# Patient Record
Sex: Male | Born: 1980 | Race: Black or African American | Hispanic: No | Marital: Single | Smoking: Current every day smoker
Health system: Southern US, Community
[De-identification: ages and names within clinical notes are randomized; demographics above are authoritative.]

## PROBLEM LIST (undated history)

## (undated) DIAGNOSIS — J45909 Unspecified asthma, uncomplicated: Secondary | ICD-10-CM

---
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
## 2003-11-07 ENCOUNTER — Emergency Department (HOSPITAL_COMMUNITY): Admission: EM | Admit: 2003-11-07 | Discharge: 2003-11-07 | Payer: Self-pay | Admitting: Emergency Medicine

## 2008-01-06 ENCOUNTER — Emergency Department (HOSPITAL_COMMUNITY): Admission: EM | Admit: 2008-01-06 | Discharge: 2008-01-07 | Payer: Self-pay | Admitting: Emergency Medicine

## 2008-06-13 ENCOUNTER — Emergency Department (HOSPITAL_COMMUNITY): Admission: EM | Admit: 2008-06-13 | Discharge: 2008-06-13 | Payer: Self-pay | Admitting: *Deleted

## 2008-06-13 ENCOUNTER — Emergency Department (HOSPITAL_COMMUNITY): Admission: EM | Admit: 2008-06-13 | Discharge: 2008-06-14 | Payer: Self-pay | Admitting: Emergency Medicine

## 2008-06-14 ENCOUNTER — Emergency Department (HOSPITAL_COMMUNITY): Admission: EM | Admit: 2008-06-14 | Discharge: 2008-06-15 | Payer: Self-pay | Admitting: Emergency Medicine

## 2008-06-16 ENCOUNTER — Emergency Department (HOSPITAL_COMMUNITY): Admission: EM | Admit: 2008-06-16 | Discharge: 2008-06-16 | Payer: Self-pay | Admitting: Emergency Medicine

## 2010-10-11 ENCOUNTER — Emergency Department (HOSPITAL_COMMUNITY): Admission: EM | Admit: 2010-10-11 | Discharge: 2010-10-11 | Payer: Self-pay | Admitting: Emergency Medicine

## 2011-02-01 LAB — COMPREHENSIVE METABOLIC PANEL
BUN: 11 mg/dL (ref 6–23)
CO2: 25 mEq/L (ref 19–32)
Calcium: 9.8 mg/dL (ref 8.4–10.5)
Chloride: 101 mEq/L (ref 96–112)
Creatinine, Ser: 1.27 mg/dL (ref 0.4–1.5)
GFR calc Af Amer: >60 mL/min (ref >60)
GFR calc non Af Amer: >60 mL/min (ref >60)
Glucose, Bld: 107 mg/dL — ABNORMAL HIGH (ref 70–99)
Total Bilirubin: 0.9 mg/dL (ref 0.3–1.2)

## 2011-02-01 LAB — CBC
Hemoglobin: 15.2 g/dL (ref 13.0–17.0)
MCH: 24 pg — ABNORMAL LOW (ref 26.0–34.0)
MCHC: 34 g/dL (ref 30.0–36.0)
MCV: 70.6 fL — ABNORMAL LOW (ref 78.0–100.0)
RBC: 6.33 MIL/uL — ABNORMAL HIGH (ref 4.22–5.81)

## 2011-02-01 LAB — DIFFERENTIAL
Basophils Absolute: 0 10*3/uL (ref 0.0–0.1)
Eosinophils Relative: 0 % (ref 0–5)
Lymphocytes Relative: 43 % (ref 12–46)
Lymphs Abs: 4.9 10*3/uL — ABNORMAL HIGH (ref 0.7–4.0)
Monocytes Relative: 9 % (ref 3–12)
Neutrophils Relative %: 48 % (ref 43–77)

## 2011-02-01 LAB — LIPASE, BLOOD: Lipase: 47 U/L (ref 11–59)

## 2011-02-11 ENCOUNTER — Emergency Department (HOSPITAL_COMMUNITY)
Admission: EM | Admit: 2011-02-11 | Discharge: 2011-02-11 | Disposition: A | Discharge status: Home / Self Care | Payer: Self-pay | Attending: Cardiovascular Disease | Admitting: Cardiovascular Disease

## 2011-02-11 DIAGNOSIS — R197 Diarrhea, unspecified: Secondary | ICD-10-CM | POA: Insufficient documentation

## 2011-02-11 DIAGNOSIS — R112 Nausea with vomiting, unspecified: Secondary | ICD-10-CM | POA: Insufficient documentation

## 2011-02-11 LAB — POCT I-STAT, CHEM 8
Creatinine, Ser: 1.3 mg/dL (ref 0.4–1.5)
HCT: 48 % (ref 39.0–52.0)
Hemoglobin: 16.3 g/dL (ref 13.0–17.0)
Potassium: 3.8 mEq/L (ref 3.5–5.1)
Sodium: 142 mEq/L (ref 135–145)
TCO2: 25 mmol/L (ref 0–100)

## 2011-02-13 ENCOUNTER — Emergency Department (HOSPITAL_COMMUNITY)
Admission: EM | Admit: 2011-02-13 | Discharge: 2011-02-13 | Disposition: A | Discharge status: Home / Self Care | Payer: Self-pay | Attending: Emergency Medicine | Admitting: Emergency Medicine

## 2011-02-13 DIAGNOSIS — R112 Nausea with vomiting, unspecified: Secondary | ICD-10-CM | POA: Insufficient documentation

## 2011-02-13 DIAGNOSIS — B9789 Other viral agents as the cause of diseases classified elsewhere: Secondary | ICD-10-CM | POA: Insufficient documentation

## 2011-02-13 LAB — POCT I-STAT, CHEM 8
Glucose, Bld: 108 mg/dL — ABNORMAL HIGH (ref 70–99)
HCT: 47 % (ref 39.0–52.0)
Hemoglobin: 16 g/dL (ref 13.0–17.0)
Potassium: 3.5 mEq/L (ref 3.5–5.1)

## 2011-02-14 ENCOUNTER — Observation Stay (HOSPITAL_COMMUNITY)
Admission: EM | Admit: 2011-02-14 | Discharge: 2011-02-14 | Disposition: A | Discharge status: Home / Self Care | Payer: Self-pay | Attending: Emergency Medicine | Admitting: Emergency Medicine

## 2011-02-14 ENCOUNTER — Emergency Department (HOSPITAL_COMMUNITY): Payer: Self-pay

## 2011-02-14 DIAGNOSIS — R197 Diarrhea, unspecified: Secondary | ICD-10-CM | POA: Insufficient documentation

## 2011-02-14 DIAGNOSIS — R109 Unspecified abdominal pain: Secondary | ICD-10-CM | POA: Insufficient documentation

## 2011-02-14 DIAGNOSIS — R112 Nausea with vomiting, unspecified: Principal | ICD-10-CM | POA: Insufficient documentation

## 2011-02-14 LAB — DIFFERENTIAL
Basophils Absolute: 0 10*3/uL (ref 0.0–0.1)
Lymphs Abs: 2.8 10*3/uL (ref 0.7–4.0)
Monocytes Absolute: 0.8 10*3/uL (ref 0.1–1.0)
Monocytes Relative: 7 % (ref 3–12)
Neutrophils Relative %: 69 % (ref 43–77)

## 2011-02-14 LAB — CBC
Hemoglobin: 14.5 g/dL (ref 13.0–17.0)
MCHC: 34.3 g/dL (ref 30.0–36.0)
RDW: 15.8 % — ABNORMAL HIGH (ref 11.5–15.5)
WBC: 11.5 10*3/uL — ABNORMAL HIGH (ref 4.0–10.5)

## 2011-02-14 LAB — COMPREHENSIVE METABOLIC PANEL
ALT: 16 U/L (ref 0–53)
AST: 18 U/L (ref 0–37)
Albumin: 4.2 g/dL (ref 3.5–5.2)
Calcium: 9 mg/dL (ref 8.4–10.5)
GFR calc Af Amer: >60 mL/min (ref >60)
Sodium: 137 mEq/L (ref 135–145)
Total Protein: 7.2 g/dL (ref 6.0–8.3)

## 2011-02-14 MED ORDER — IOHEXOL 300 MG/ML  SOLN
100.0000 mL | Freq: Once | INTRAMUSCULAR | Status: AC | PRN
  Administered 2011-02-14: 100 mL via INTRAVENOUS
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
## 2011-04-08 NOTE — Op Note (Signed)
[facility]  Patient:    Edward Barrera, Edward Barrera                     MRN: [id number] Proc. Date: 02/01/01 Adm. Date:  [id number] Disc. Date: [id number] Attending:  Carren Rang K                           Operative Report  PREOPERATIVE DIAGNOSIS:  Displaced fourth and fifth proximal phalanx fractures left hand, status post initial reduction with progressive angulation.  POSTOPERATIVE DIAGNOSIS:  Displaced fourth and fifth proximal phalanx fractures left hand, status post initial reduction with progressive angulation.  PROCEDURE:  Attempted closed reduction and pinning, followed by formal open reduction/internal fixation left fourth and fifth proximal phalanx fractures.  SURGEON:  Elisha Ponder, M.D.  ASSISTANT:  Dorie Rank, P.A.  ANESTHESIA:  LMA general anesthetic.  TOURNIQUET TIME:  Less than 60 minutes.  DRAINS:  None.  COMPLICATIONS:  None.  INDICATIONS FOR THE PROCEDURE:  This patient is a very pleasant 30 year old black male, who presents with the above-mentioned diagnosis.  I have counselled he and his mother in regards to risk and benefits of surgery, including risk of infection, bleeding, anesthesia, damage to normal structures and failure of surgery to accomplish its intended goals of relieving symptoms and restoring function.  With this in mind, he decides to proceed.  All questions have been encouraged and answered preoperatively.  OPERATIVE FINDINGS:  Patient had highly unstable displaced transverse proximal phalanx fractures.  Attempted closed reduction was performed, followed by a formal open reduction/internal fixation.  K-wires 0.045 were used for fixation.  Fixation was firm and stable.  DESCRIPTION OF PROCEDURE:  The patient was taken to the operative suite after counseling him by myself and anesthesia.  Once in the operative suite, he underwent prophylactic antibiotics in the form of Ancef.  He was laid  supine, appropriate padded.  LMA general anesthetic was introduced smoothly. Following this, the left upper extremity was prepped and draped in the usual sterile fashion.  This was supervised by myself.  Once this was done, the sterile field was secured, the patient had attempted closed reduction of the fourth and fifth proximal phalanx fractures.  Closed reduction was unsuccessful.  The patient had highly unstable fractures and difficulty was noted in trying to reduce these.  With this noted, the patient then had formal open reduction accomplished.  This was done by elevating the upper extremity (left upper extremity) and following this, tourniquet was inflated to 250 mmHg.  Two longitudinal incisions were made dorsally in the midline over the fourth and fifth proximal phalanx.  This was in the proximal aspect. Dissection was carried down in both areas through the subcu, crossing veins were coagulated with bipolar electrocautery and the patient then had the extensor tendon split and carefully retracted at the fracture site.  The periosteum was opened in a L-shaped fashion for later repair and the fracture site was exposed, curetted and then irrigated.  Once this was done, two 0.045 K-wires were placed from a proximal to distal direction, exited about the mid lateral line of the fingers and were withdrawn to the fracture site of the distal fragment.  Following this, reduction was accomplished visually under 4.0 loupe magnification.  This was held and the wires were then advanced proximally until they engaged the cortex nicely.  This provided firm fixation. This was done for both the fourth and fifth proximal  phalanx fractures.  Both these fractures were highly unstable.  The fifth fracture site was button holed volarly.   This may portend to increase stiffness in the flexor tendon region.  I have discussed these issues with the patients mother postoperatively.  Once the fractures were  pinned successfully, the patient underwent permanent radiographs for documentation, followed by tourniquet deflation, closure of the wounds about the fourth and fifth regions was accomplished with chromic in the periosteal tissue, followed by Mersilene of the 4-0 variety in the extensor tendon followed by Prolene suture of the 4-0 variety in the skin edge.  The patient tolerated this well without difficulty. Pins were left outside the skin, bent 45 degrees, caps were placed, Xeroform was placed over the wounds, excellent refill was noted, Marcaine 0.25% without epinephrine was placed for postoperative analgesia and the patient tolerated the procedure well.  He was extubated in the operative suite and transferred to recovery room in stable condition.  All sponge, needle and instruments counts reported as correct.  There were no complications.  He will be discharged home on Keflex x 4 days, Vicodin and Robaxin.  He will return to our office in approximately seven days.  All questions have been encouraged and answered. DD:  02/01/01 TD:  02/01/01 Job: [id number]

## 2011-08-19 LAB — CBC
HCT: 49.3
HCT: 53 — ABNORMAL HIGH
Hemoglobin: 16.8
MCHC: 31.8
MCHC: 32.2
MCV: 76.3 — ABNORMAL LOW
MCV: 76.3 — ABNORMAL LOW
Platelets: 181
RBC: 6.61 — ABNORMAL HIGH
RDW: 16 — ABNORMAL HIGH
RDW: 16.8 — ABNORMAL HIGH
WBC: 10.3

## 2011-08-19 LAB — COMPREHENSIVE METABOLIC PANEL
Albumin: 4.6
Alkaline Phosphatase: 39
Alkaline Phosphatase: 44
BUN: 10
BUN: 18
Calcium: 10.3
Calcium: 9.5
Glucose, Bld: 95
Potassium: 3.4 — ABNORMAL LOW
Potassium: 3.8
Sodium: 132 — ABNORMAL LOW
Total Protein: 7.5
Total Protein: 8.3

## 2011-08-19 LAB — DIFFERENTIAL
Basophils Relative: 0
Basophils Relative: 0
Eosinophils Absolute: 0
Lymphocytes Relative: 27
Lymphs Abs: 2.6
Lymphs Abs: 2.8
Monocytes Absolute: 1.3 — ABNORMAL HIGH
Monocytes Relative: 12
Monocytes Relative: 13 — ABNORMAL HIGH
Monocytes Relative: 9
Neutro Abs: 5.9
Neutro Abs: 6.2
Neutro Abs: 8.2 — ABNORMAL HIGH
Neutrophils Relative %: 60
Neutrophils Relative %: 68

## 2011-08-19 LAB — URINALYSIS, ROUTINE W REFLEX MICROSCOPIC
Glucose, UA: NEGATIVE
Hgb urine dipstick: NEGATIVE
Specific Gravity, Urine: 1.027

## 2011-08-19 LAB — POCT I-STAT, CHEM 8
BUN: 17
Chloride: 101
Creatinine, Ser: 1.2
Glucose, Bld: 92
Potassium: 3.6

## 2011-08-19 LAB — URINE MICROSCOPIC-ADD ON

## 2012-04-29 ENCOUNTER — Encounter (HOSPITAL_COMMUNITY)
Admission: EM | Disposition: A | Discharge status: Home / Self Care | Payer: Self-pay | Source: Home / Self Care | Attending: Orthopedic Surgery

## 2012-04-29 ENCOUNTER — Encounter (HOSPITAL_COMMUNITY): Payer: Self-pay | Admitting: Anesthesiology

## 2012-04-29 ENCOUNTER — Encounter (HOSPITAL_COMMUNITY): Payer: Self-pay | Admitting: Emergency Medicine

## 2012-04-29 ENCOUNTER — Emergency Department (HOSPITAL_COMMUNITY): Payer: Self-pay | Admitting: Anesthesiology

## 2012-04-29 ENCOUNTER — Encounter (HOSPITAL_COMMUNITY): Payer: Self-pay | Admitting: Orthopedic Surgery

## 2012-04-29 ENCOUNTER — Inpatient Hospital Stay (HOSPITAL_COMMUNITY)
Admission: EM | Admit: 2012-04-29 | Discharge: 2012-05-01 | DRG: 906 | Disposition: A | Discharge status: Home / Self Care | Payer: Self-pay | Attending: Orthopedic Surgery | Admitting: Orthopedic Surgery

## 2012-04-29 ENCOUNTER — Emergency Department (HOSPITAL_COMMUNITY): Payer: Self-pay

## 2012-04-29 DIAGNOSIS — F17200 Nicotine dependence, unspecified, uncomplicated: Secondary | ICD-10-CM | POA: Diagnosis present

## 2012-04-29 DIAGNOSIS — S61209A Unspecified open wound of unspecified finger without damage to nail, initial encounter: Principal | ICD-10-CM | POA: Diagnosis present

## 2012-04-29 DIAGNOSIS — M8448XA Pathological fracture, other site, initial encounter for fracture: Secondary | ICD-10-CM | POA: Diagnosis present

## 2012-04-29 DIAGNOSIS — Z1832 Retained tooth: Secondary | ICD-10-CM

## 2012-04-29 HISTORY — PX: I&D EXTREMITY: SHX5045

## 2012-04-29 LAB — BASIC METABOLIC PANEL
Calcium: 9.5 mg/dL (ref 8.4–10.5)
GFR calc Af Amer: >90 mL/min (ref >90)
GFR calc non Af Amer: 81 mL/min — ABNORMAL LOW (ref >90)
Glucose, Bld: 96 mg/dL (ref 70–99)
Potassium: 4.1 mEq/L (ref 3.5–5.1)
Sodium: 142 mEq/L (ref 135–145)

## 2012-04-29 LAB — CBC
MCH: 23.6 pg — ABNORMAL LOW (ref 26.0–34.0)
MCHC: 32.7 g/dL (ref 30.0–36.0)
Platelets: 209 10*3/uL (ref 150–400)
RDW: 16.8 % — ABNORMAL HIGH (ref 11.5–15.5)

## 2012-04-29 SURGERY — IRRIGATION AND DEBRIDEMENT EXTREMITY
Anesthesia: General | Site: Hand | Laterality: Right | Wound class: Contaminated

## 2012-04-29 MED ORDER — OXYCODONE HCL 5 MG PO TABS
5.0000 mg | ORAL_TABLET | ORAL | Status: DC | PRN
  Administered 2012-04-29: 10 mg via ORAL
  Administered 2012-04-29 (×2): 5 mg via ORAL
  Administered 2012-04-30 – 2012-05-01 (×10): 10 mg via ORAL
  Filled 2012-04-29 (×9): qty 2
  Filled 2012-04-29: qty 1
  Filled 2012-04-29: qty 2
  Filled 2012-04-29: qty 1
  Filled 2012-04-29: qty 2

## 2012-04-29 MED ORDER — LACTATED RINGERS IV SOLN
INTRAVENOUS | Status: DC
  Administered 2012-04-29: 20:00:00 via INTRAVENOUS

## 2012-04-29 MED ORDER — FENTANYL CITRATE 0.05 MG/ML IJ SOLN
INTRAMUSCULAR | Status: DC | PRN
  Administered 2012-04-29 (×3): 50 ug via INTRAVENOUS
  Administered 2012-04-29: 100 ug via INTRAVENOUS

## 2012-04-29 MED ORDER — DOCUSATE SODIUM 100 MG PO CAPS
100.0000 mg | ORAL_CAPSULE | Freq: Two times a day (BID) | ORAL | Status: DC
  Administered 2012-04-29 – 2012-05-01 (×5): 100 mg via ORAL

## 2012-04-29 MED ORDER — DEXAMETHASONE SODIUM PHOSPHATE 10 MG/ML IJ SOLN
INTRAMUSCULAR | Status: DC | PRN
  Administered 2012-04-29: 10 mg via INTRAVENOUS

## 2012-04-29 MED ORDER — PROMETHAZINE HCL 25 MG RE SUPP: 12.5000 mg | Freq: Four times a day (QID) | RECTAL | Status: DC | PRN

## 2012-04-29 MED ORDER — ONDANSETRON HCL 4 MG PO TABS: 4.0000 mg | ORAL_TABLET | Freq: Four times a day (QID) | ORAL | Status: DC | PRN

## 2012-04-29 MED ORDER — ACETAMINOPHEN 10 MG/ML IV SOLN
INTRAVENOUS | Status: DC | PRN
  Administered 2012-04-29: 1000 mg via INTRAVENOUS

## 2012-04-29 MED ORDER — PROPOFOL 10 MG/ML IV EMUL
INTRAVENOUS | Status: DC | PRN
  Administered 2012-04-29: 200 mg via INTRAVENOUS

## 2012-04-29 MED ORDER — MORPHINE SULFATE 2 MG/ML IJ SOLN
1.0000 mg | INTRAMUSCULAR | Status: DC | PRN
  Administered 2012-04-29: 1 mg via INTRAVENOUS
  Filled 2012-04-29: qty 1

## 2012-04-29 MED ORDER — ONDANSETRON HCL 4 MG/2ML IJ SOLN
INTRAMUSCULAR | Status: DC | PRN
  Administered 2012-04-29: 4 mg via INTRAVENOUS

## 2012-04-29 MED ORDER — HYDROMORPHONE HCL PF 1 MG/ML IJ SOLN
1.0000 mg | Freq: Once | INTRAMUSCULAR | Status: AC
  Administered 2012-04-29: 1 mg via INTRAVENOUS
  Filled 2012-04-29: qty 1

## 2012-04-29 MED ORDER — ONDANSETRON HCL 4 MG/2ML IJ SOLN: 4.0000 mg | Freq: Four times a day (QID) | INTRAMUSCULAR | Status: DC | PRN

## 2012-04-29 MED ORDER — LACTATED RINGERS IV SOLN
INTRAVENOUS | Status: DC | PRN
  Administered 2012-04-29: 10:00:00 via INTRAVENOUS

## 2012-04-29 MED ORDER — ACETAMINOPHEN 10 MG/ML IV SOLN
INTRAVENOUS | Status: AC
  Filled 2012-04-29: qty 100

## 2012-04-29 MED ORDER — SODIUM CHLORIDE 0.9 % IV SOLN
3.0000 g | Freq: Four times a day (QID) | INTRAVENOUS | Status: DC
  Administered 2012-04-29 – 2012-05-01 (×8): 3 g via INTRAVENOUS
  Filled 2012-04-29 (×11): qty 3

## 2012-04-29 MED ORDER — SUCCINYLCHOLINE CHLORIDE 20 MG/ML IJ SOLN
INTRAMUSCULAR | Status: DC | PRN
Start: 2012-04-29 — End: 2012-04-29
  Administered 2012-04-29: 100 mg via INTRAVENOUS

## 2012-04-29 MED ORDER — SODIUM CHLORIDE 0.9 % IV SOLN
3.0000 g | Freq: Once | INTRAVENOUS | Status: AC
  Administered 2012-04-29: 3 g via INTRAVENOUS
  Filled 2012-04-29: qty 3

## 2012-04-29 MED ORDER — LIDOCAINE HCL (CARDIAC) 20 MG/ML IV SOLN
INTRAVENOUS | Status: DC | PRN
  Administered 2012-04-29: 100 mg via INTRAVENOUS

## 2012-04-29 MED ORDER — TETANUS-DIPHTH-ACELL PERTUSSIS 5-2.5-18.5 LF-MCG/0.5 IM SUSP
0.5000 mL | Freq: Once | INTRAMUSCULAR | Status: AC
  Administered 2012-04-29: 0.5 mL via INTRAMUSCULAR
  Filled 2012-04-29: qty 0.5

## 2012-04-29 MED ORDER — FENTANYL CITRATE 0.05 MG/ML IJ SOLN: 25.0000 ug | INTRAMUSCULAR | Status: DC | PRN

## 2012-04-29 MED ORDER — VITAMIN C 500 MG PO TABS
1000.0000 mg | ORAL_TABLET | Freq: Every day | ORAL | Status: DC
  Administered 2012-04-29 – 2012-05-01 (×3): 1000 mg via ORAL
  Filled 2012-04-29 (×3): qty 2

## 2012-04-29 MED ORDER — LACTATED RINGERS IV SOLN: INTRAVENOUS | Status: DC

## 2012-04-29 SURGICAL SUPPLY — 40 items
BAG ZIPLOCK 12X15 (MISCELLANEOUS) ×2 IMPLANT
BANDAGE ELASTIC 3 VELCRO ST LF (GAUZE/BANDAGES/DRESSINGS) ×2 IMPLANT
BANDAGE ELASTIC 4 VELCRO ST LF (GAUZE/BANDAGES/DRESSINGS) ×2 IMPLANT
BANDAGE GAUZE ELAST BULKY 4 IN (GAUZE/BANDAGES/DRESSINGS) ×2 IMPLANT
BLADE SURG SZ10 CARB STEEL (BLADE) ×2 IMPLANT
CLOTH BEACON ORANGE TIMEOUT ST (SAFETY) ×2 IMPLANT
CUFF TOURN SGL QUICK 18 (TOURNIQUET CUFF) ×2 IMPLANT
DRAIN PENROSE 18X1/2 LTX STRL (DRAIN) IMPLANT
DRAPE SURG 17X11 SM STRL (DRAPES) ×2 IMPLANT
DRSG EMULSION OIL 3X3 NADH (GAUZE/BANDAGES/DRESSINGS) ×2 IMPLANT
DRSG PAD ABDOMINAL 8X10 ST (GAUZE/BANDAGES/DRESSINGS) IMPLANT
ELECT REM PT RETURN 9FT ADLT (ELECTROSURGICAL) ×2
ELECTRODE REM PT RTRN 9FT ADLT (ELECTROSURGICAL) ×1 IMPLANT
GAUZE XEROFORM 1X8 LF (GAUZE/BANDAGES/DRESSINGS) ×2 IMPLANT
GLOVE BIO SURGEON STRL SZ8 (GLOVE) ×2 IMPLANT
GOWN STRL REIN XL XLG (GOWN DISPOSABLE) ×2 IMPLANT
KIT BASIN OR (CUSTOM PROCEDURE TRAY) ×2 IMPLANT
LOOP VESSEL MAXI BLUE (MISCELLANEOUS) ×2 IMPLANT
MANIFOLD NEPTUNE II (INSTRUMENTS) ×2 IMPLANT
PACK LOWER EXTREMITY WL (CUSTOM PROCEDURE TRAY) ×2 IMPLANT
PAD CAST 3X4 CTTN HI CHSV (CAST SUPPLIES) ×1 IMPLANT
PAD CAST 4YDX4 CTTN HI CHSV (CAST SUPPLIES) ×1 IMPLANT
PADDING CAST ABS 3INX4YD NS (CAST SUPPLIES) ×1
PADDING CAST ABS COTTON 3X4 (CAST SUPPLIES) ×1 IMPLANT
PADDING CAST COTTON 3X4 STRL (CAST SUPPLIES) ×1
PADDING CAST COTTON 4X4 STRL (CAST SUPPLIES) ×1
POSITIONER SURGICAL ARM (MISCELLANEOUS) ×2 IMPLANT
SOL PREP POV-IOD 16OZ 10% (MISCELLANEOUS) ×2 IMPLANT
SOL PREP PROV IODINE SCRUB 4OZ (MISCELLANEOUS) ×2 IMPLANT
SPLINT PLASTER CAST XFAST 4X15 (CAST SUPPLIES) ×1 IMPLANT
SPLINT PLASTER XTRA FAST SET 4 (CAST SUPPLIES) ×1
SPONGE GAUZE 4X4 12PLY (GAUZE/BANDAGES/DRESSINGS) ×2 IMPLANT
SUT PROLENE 3 0 PS 2 (SUTURE) IMPLANT
SUT VIC AB 1 CT1 27 (SUTURE) ×1
SUT VIC AB 1 CT1 27XBRD ANTBC (SUTURE) ×1 IMPLANT
SUT VIC AB 2-0 CT1 27 (SUTURE)
SUT VIC AB 2-0 CT1 27XBRD (SUTURE) IMPLANT
SYR 20CC LL (SYRINGE) ×2 IMPLANT
SYR CONTROL 10ML LL (SYRINGE) ×2 IMPLANT
TOWEL OR 17X26 10 PK STRL BLUE (TOWEL DISPOSABLE) ×2 IMPLANT

## 2012-04-29 NOTE — Progress Notes (Signed)
Dr. Amanda Pea in- made aware of patient stating left index finger feels numb- blanches satisfactorily- can tell it is being touched- can move it.

## 2012-04-29 NOTE — Anesthesia Preprocedure Evaluation (Signed)
Anesthesia Evaluation  Patient identified by MRN, date of birth, ID band Patient awake    Reviewed: Allergy & Precautions, H&P , NPO status , Patient's Chart, lab work & pertinent test results  Airway Mallampati: II TM Distance: >3 FB Neck ROM: full    Dental No notable dental hx. (+) Teeth Intact and Dental Advisory Given   Pulmonary neg pulmonary ROS,  breath sounds clear to auscultation  Pulmonary exam normal       Cardiovascular Exercise Tolerance: Good negative cardio ROS  Rhythm:regular Rate:Normal     Neuro/Psych negative neurological ROS  negative psych ROS   GI/Hepatic negative GI ROS, Neg liver ROS,   Endo/Other  negative endocrine ROS  Renal/GU negative Renal ROS  negative genitourinary   Musculoskeletal   Abdominal   Peds  Hematology negative hematology ROS (+)   Anesthesia Other Findings   Reproductive/Obstetrics negative OB ROS                           Anesthesia Physical Anesthesia Plan  ASA: I and Emergent  Anesthesia Plan: General   Post-op Pain Management:    Induction: Intravenous  Airway Management Planned: LMA  Additional Equipment:   Intra-op Plan:   Post-operative Plan:   Informed Consent: I have reviewed the patients History and Physical, chart, labs and discussed the procedure including the risks, benefits and alternatives for the proposed anesthesia with the patient or authorized representative who has indicated his/her understanding and acceptance.   Dental Advisory Given  Plan Discussed with: CRNA and Surgeon  Anesthesia Plan Comments:         Anesthesia Quick Evaluation  

## 2012-04-29 NOTE — Op Note (Signed)
See operative dictation #[id number] Dominica Severin MD

## 2012-04-29 NOTE — ED Notes (Signed)
Patient removed arm splint. RN suggests to keep arm splint on, patient sts he doesn't want it on, that it hurts too much.

## 2012-04-29 NOTE — Anesthesia Postprocedure Evaluation (Signed)
  Anesthesia Post-op Note  Patient: Edward Barrera  Procedure(s) Performed: Procedure(s) (LRB): IRRIGATION AND DEBRIDEMENT EXTREMITY (Right)  Patient Location: PACU  Anesthesia Type: General  Level of Consciousness: awake and alert   Airway and Oxygen Therapy: Patient Spontanous Breathing  Post-op Pain: mild  Post-op Assessment: Post-op Vital signs reviewed, Patient's Cardiovascular Status Stable, Respiratory Function Stable, Patent Airway and No signs of Nausea or vomiting  Post-op Vital Signs: stable  Complications: No apparent anesthesia complications
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
## 2012-04-29 NOTE — Transfer of Care (Signed)
Immediate Anesthesia Transfer of Care Note  Patient: Edward Barrera  Procedure(s) Performed: Procedure(s) (LRB): IRRIGATION AND DEBRIDEMENT EXTREMITY (Right)  Patient Location: PACU  Anesthesia Type: General  Level of Consciousness: awake, alert  and oriented  Airway & Oxygen Therapy: Patient Spontanous Breathing and Patient connected to face mask oxygen  Post-op Assessment: Report given to PACU RN and Post -op Vital signs reviewed and stable  Post vital signs: Reviewed and stable  Complications: No apparent anesthesia complications

## 2012-04-29 NOTE — Preoperative (Signed)
Beta Blockers   Reason not to administer Beta Blockers:Not Applicable 
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
## 2012-04-30 ENCOUNTER — Encounter (HOSPITAL_COMMUNITY): Payer: Self-pay | Admitting: Orthopedic Surgery
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
## 2012-05-01 LAB — WOUND CULTURE: Gram Stain: NONE SEEN

## 2012-05-01 MED ORDER — OXYCODONE HCL 5 MG PO TABS: 5.0000 mg | ORAL_TABLET | ORAL | Status: AC | PRN

## 2012-05-01 MED ORDER — AMOXICILLIN-POT CLAVULANATE 875-125 MG PO TABS: 1.0000 | ORAL_TABLET | Freq: Two times a day (BID) | ORAL | Status: AC
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
## 2012-05-01 NOTE — Progress Notes (Signed)
Pt discharged ambulatory to [facility]. Assessment unchanged from am.  

## 2012-05-04 LAB — ANAEROBIC CULTURE: Gram Stain: NONE SEEN

## 2013-08-14 IMAGING — CR DG WRIST COMPLETE 3+V*R*
4 series · 4 of 4 positions shown · non-contrast
Comparison: Contemporaneous hand radiograph

CLINICAL DATA: Right hand pain status post injury.

RIGHT WRIST - COMPLETE 3+ VIEW

[x wrist pa right]
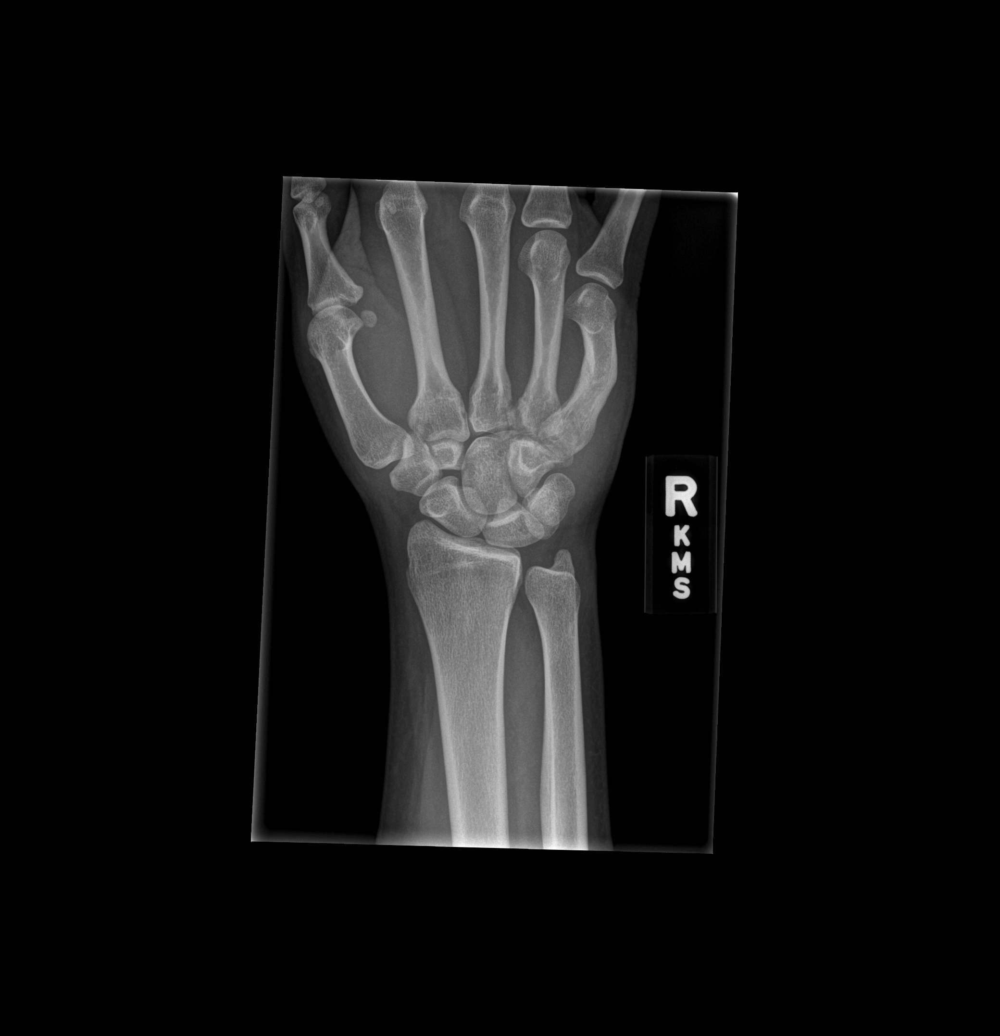

[x wrist obl right]
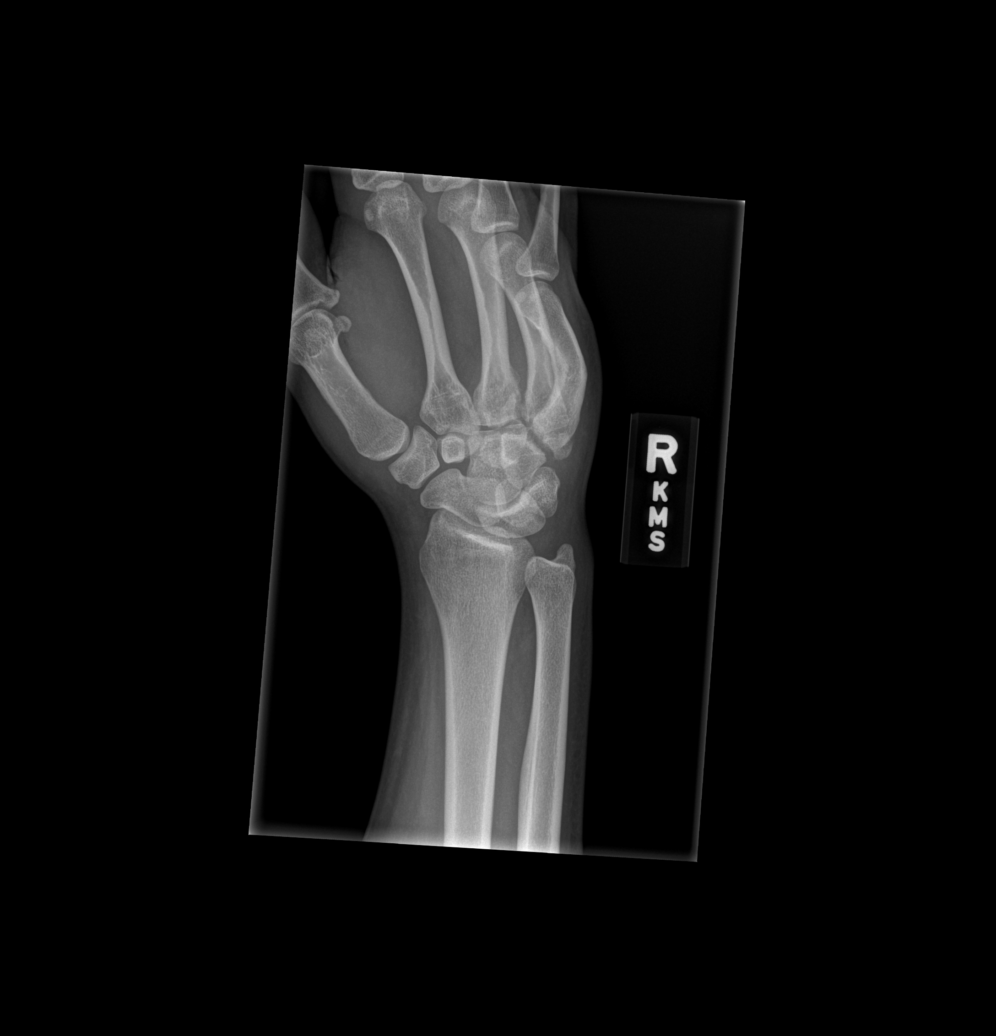

[x wrist lat right]
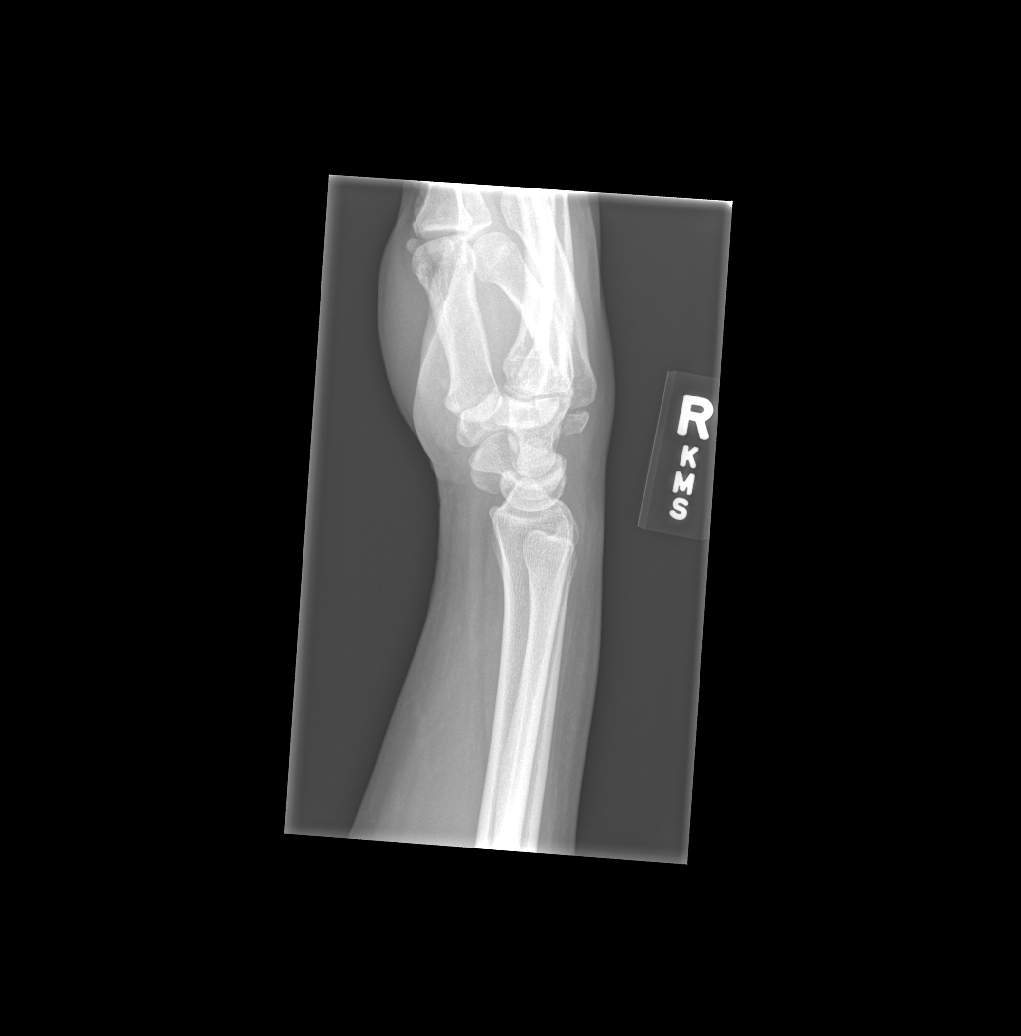

[x wrist navicular view right]
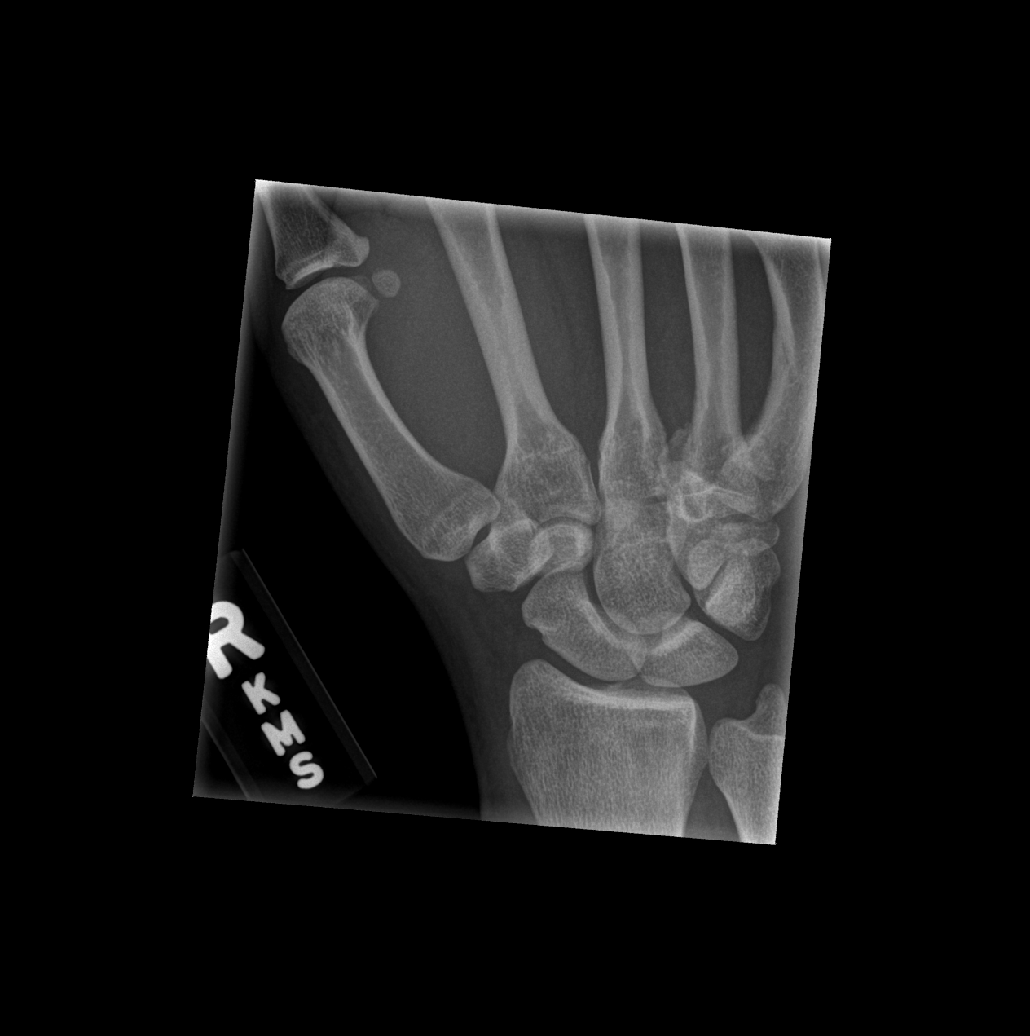

[4 of 4 positions shown; findings below may reference images not displayed]

FINDINGS: There is a displaced fracture that appears to arise from
the hamate and the base of the fourth metacarpal.  This is age
indeterminate.  Angulation of the fifth metacarpal appears remote.
No additional fracture.  No aggressive osseous lesion. Intact
radiocarpal articulation.
IMPRESSION: Hamate body and base of the fourth metacarpal fractures are age
indeterminate.  Remote fifth metacarpal fracture.

## 2014-11-21 ENCOUNTER — Emergency Department (HOSPITAL_COMMUNITY)
Admission: EM | Admit: 2014-11-21 | Discharge: 2014-11-21 | Disposition: A | Discharge status: Home / Self Care | Payer: Self-pay | Attending: Emergency Medicine | Admitting: Emergency Medicine

## 2014-11-21 ENCOUNTER — Encounter (HOSPITAL_COMMUNITY): Payer: Self-pay | Admitting: Emergency Medicine

## 2014-11-21 DIAGNOSIS — Z720 Tobacco use: Secondary | ICD-10-CM | POA: Insufficient documentation

## 2014-11-21 DIAGNOSIS — H9202 Otalgia, left ear: Secondary | ICD-10-CM | POA: Insufficient documentation

## 2014-11-21 DIAGNOSIS — K089 Disorder of teeth and supporting structures, unspecified: Secondary | ICD-10-CM | POA: Insufficient documentation

## 2014-11-21 MED ORDER — HYDROCODONE-ACETAMINOPHEN 5-325 MG PO TABS: 1.0000 | ORAL_TABLET | ORAL | Status: DC | PRN

## 2014-11-21 MED ORDER — HYDROCODONE-ACETAMINOPHEN 5-325 MG PO TABS
1.0000 | ORAL_TABLET | Freq: Once | ORAL | Status: AC
  Administered 2014-11-21: 1 via ORAL
  Filled 2014-11-21: qty 1

## 2014-11-21 MED ORDER — IBUPROFEN 800 MG PO TABS: 800.0000 mg | ORAL_TABLET | Freq: Three times a day (TID) | ORAL | Status: DC

## 2014-11-21 NOTE — ED Provider Notes (Signed)
CSN: [id number]     Arrival date & time 11/21/14  2217 History  This chart was scribed for Elpidio Anis, PA-C with Suzi Roots, MD by Tonye Royalty, ED Scribe. This patient was seen in room WTR7/WTR7 and the patient's care was started at 11:15 PM.    Chief Complaint  Patient presents with  . Otalgia   The history is provided by the patient. No language interpreter was used.    HPI Comments: Edward Barrera is a 34 y.o. male who presents to the Emergency Department complaining of intermittent left ear pain with onset 2 months ago. He states the pain lasts 1.5-2 weeks at a time and resolves for a few weeks. He reports pain to the left side of his face. He also notes left-sided dental problem. He states his wisdom teeth were never removed. However, he notes that he has at times had ear pain without dental pain. He states he has used OTC ear drops without improvement. He denies sore throat, fever, congestion, facial swelling, left ear pain, or hearing change.  History reviewed. No pertinent past medical history. Past Surgical History  Procedure Laterality Date  . I&d extremity  04/29/2012    Procedure: IRRIGATION AND DEBRIDEMENT EXTREMITY;  Surgeon: Dominica Severin, MD;  Location: [facility];  Service: Orthopedics;  Laterality: Right;   History reviewed. No pertinent family history. History  Substance Use Topics  . Smoking status: Current Every Day Smoker  . Smokeless tobacco: Never Used  . Alcohol Use: Yes    Review of Systems  Constitutional: Negative for fever.  HENT: Positive for dental problem and ear pain. Negative for congestion, facial swelling and sore throat.       Allergies  Review of patient's allergies indicates no known allergies.  Home Medications   Prior to Admission medications   Not on File   BP 138/92 mmHg  Pulse 76  Temp(Src) 97.5 F (36.4 C) (Oral)  Resp 16  Ht  (1.88 m)  Wt 250 lb (113.399 kg)  BMI 32.08 kg/m2  SpO2 98% Physical Exam   Constitutional: He is oriented to person, place, and time. He appears well-developed and well-nourished.  HENT:  Head: Normocephalic and atraumatic.  Right Ear: Tympanic membrane, external ear and ear canal normal.  Left Ear: Tympanic membrane, external ear and ear canal normal.  TMs are clear bilaterally External canals are normal  On mouth, wisdom teeth present without obvious inflammation No visualized abscess  Eyes: Conjunctivae are normal.  Neck: Normal range of motion. Neck supple.  Pulmonary/Chest: Effort normal.  Musculoskeletal: Normal range of motion.  Neurological: He is alert and oriented to person, place, and time.  Skin: Skin is warm and dry.  Psychiatric: He has a normal mood and affect.  Nursing note and vitals reviewed.   ED Course  Procedures (including critical care time)  DIAGNOSTIC STUDIES: Oxygen Saturation is 98% on room air, normal by my interpretation.    COORDINATION OF CARE: 11:20 PM Discussed with patient that I suspect his symptoms may be dental in origin. Will give referral to both ENT and oral surgeon and prescribe pain medication. The patient agrees with the plan and has no further questions at this time.   Labs Review Labs Reviewed - No data to display  Imaging Review No results found.   EKG Interpretation None      MDM   Final diagnoses:  None    1. 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 Will make appropriate referrals.  I personally performed the services described in this documentation, which was scribed in my presence. The recorded information has been reviewed and is accurate.      Arnoldo Hooker, PA-C 11/25/14 2320  Suzi Roots, MD 11/26/14 [phone]

## 2014-11-21 NOTE — ED Notes (Signed)
Pt reports intermittent L earache since 11/20. Reports taking OTC ear drops and pain increased to 10/10 tonight.

## 2014-11-21 NOTE — Discharge Instructions (Signed)
Otalgia  The most common reason for this in children is an infection of the middle ear. Pain from the middle ear is usually caused by a build-up of fluid and pressure behind the eardrum. Pain from an earache can be sharp, dull, or burning. The pain may be temporary or constant. The middle ear is connected to the nasal passages by a short narrow tube called the Eustachian tube. The Eustachian tube allows fluid to drain out of the middle ear, and helps keep the pressure in your ear equalized.  CAUSES   A cold or allergy can block the Eustachian tube with inflammation and the build-up of secretions. This is especially likely in small children, because their Eustachian tube is shorter and more horizontal. When the Eustachian tube closes, the normal flow of fluid from the middle ear is stopped. Fluid can accumulate and cause stuffiness, pain, hearing loss, and an ear infection if germs start growing in this area.  SYMPTOMS   The symptoms of an ear infection may include fever, ear pain, fussiness, increased crying, and irritability. Many children will have temporary and minor hearing loss during and right after an ear infection. Permanent hearing loss is rare, but the risk increases the more infections a child has. Other causes of ear pain include retained water in the outer ear canal from swimming and bathing.  Ear pain in adults is less likely to be from an ear infection. Ear pain may be referred from other locations. Referred pain may be from the joint between your jaw and the skull. It may also come from a tooth problem or problems in the neck. Other causes of ear pain include:   A foreign body in the ear.   Outer ear infection.   Sinus infections.   Impacted ear wax.   Ear injury.   Arthritis of the jaw or TMJ problems.   Middle ear infection.   Tooth infections.   Sore throat with pain to the ears.  DIAGNOSIS   Your caregiver can usually make the diagnosis by examining you. Sometimes other special studies,  including x-rays and lab work may be necessary.  TREATMENT    If antibiotics were prescribed, use them as directed and finish them even if you or your child's symptoms seem to be improved.   Sometimes PE tubes are needed in children. These are little plastic tubes which are put into the eardrum during a simple surgical procedure. They allow fluid to drain easier and allow the pressure in the middle ear to equalize. This helps relieve the ear pain caused by pressure changes.  HOME CARE INSTRUCTIONS    Only take over-the-counter or prescription medicines for pain, discomfort, or fever as directed by your caregiver. DO NOT GIVE CHILDREN ASPIRIN because of the association of Reye's Syndrome in children taking aspirin.   Use a cold pack applied to the outer ear for 15-20 minutes, 03-04 times per day or as needed may reduce pain. Do not apply ice directly to the skin. You may cause frost bite.   Over-the-counter ear drops used as directed may be effective. Your caregiver may sometimes prescribe ear drops.   Resting in an upright position may help reduce pressure in the middle ear and relieve pain.   Ear pain caused by rapidly descending from high altitudes can be relieved by swallowing or chewing gum. Allowing infants to suck on a bottle during airplane travel can help.   Do not smoke in the house or near children. If you are   unable to quit smoking, smoke outside.   Control allergies.  SEEK IMMEDIATE MEDICAL CARE IF:    You or your child are becoming sicker.   Pain or fever relief is not obtained with medicine.   You or your child's symptoms (pain, fever, or irritability) do not improve within 24 to 48 hours or as instructed.   Severe pain suddenly stops hurting. This may indicate a ruptured eardrum.   You or your children develop new problems such as severe headaches, stiff neck, difficulty swallowing, or swelling of the face or around the ear.  Document Released: 06/24/2004 Document Revised: 01/30/2012  Document Reviewed: 10/29/2008  [employer] Patient Information 2015 [employer]. This information is not intended to replace advice given to you by your health care provider. Make sure you discuss any questions you have with your health care provider.

## 2015-09-07 ENCOUNTER — Emergency Department (HOSPITAL_COMMUNITY)
Admission: EM | Admit: 2015-09-07 | Discharge: 2015-09-07 | Disposition: A | Discharge status: Home / Self Care | Payer: Self-pay

## 2015-09-07 NOTE — ED Notes (Signed)
Called patient to triage. Unable to locate at this time. 

## 2015-09-07 NOTE — ED Notes (Signed)
Pt called again for triage and no answer 

## 2015-10-31 ENCOUNTER — Emergency Department (HOSPITAL_COMMUNITY)
Admission: EM | Admit: 2015-10-31 | Discharge: 2015-11-01 | Disposition: A | Discharge status: Home / Self Care | Payer: Self-pay | Attending: Emergency Medicine | Admitting: Emergency Medicine

## 2015-10-31 ENCOUNTER — Encounter (HOSPITAL_COMMUNITY): Payer: Self-pay

## 2015-10-31 DIAGNOSIS — R197 Diarrhea, unspecified: Secondary | ICD-10-CM | POA: Insufficient documentation

## 2015-10-31 DIAGNOSIS — Z79899 Other long term (current) drug therapy: Secondary | ICD-10-CM | POA: Insufficient documentation

## 2015-10-31 DIAGNOSIS — R109 Unspecified abdominal pain: Secondary | ICD-10-CM | POA: Insufficient documentation

## 2015-10-31 DIAGNOSIS — R200 Anesthesia of skin: Secondary | ICD-10-CM | POA: Insufficient documentation

## 2015-10-31 DIAGNOSIS — F17210 Nicotine dependence, cigarettes, uncomplicated: Secondary | ICD-10-CM | POA: Insufficient documentation

## 2015-10-31 DIAGNOSIS — R112 Nausea with vomiting, unspecified: Secondary | ICD-10-CM | POA: Insufficient documentation

## 2015-10-31 HISTORY — DX: Unspecified asthma, uncomplicated: J45.909
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
## 2015-10-31 NOTE — ED Provider Notes (Addendum)
By signing my name below, I, Evon Slack, attest that this documentation has been prepared under the direction and in the presence of [employer], DO. Electronically Signed: Evon Slack, ED Scribe. 10/31/2015. 11:45 PM.  TIME SEEN: 11:46 PM   CHIEF COMPLAINT: abdominal pain and vomiting   HPI: HPI Comments: Edward Barrera is a 34 y.o. male brought in by ambulance, who presents to the Emergency Department complaining of abdominal pain onset 5 hours PTA. Pt state that he has associated chills, diarrhea, vomiting, and sharp CP. Pt also reports resolved numbness in his bilateral hands and lips. Pt describes the pain as someone "kneeing" in the abdomen. Pt denies dysuria, hematuria, blood in stool, penile discharge. Pt denies any recent sick contacts. Denies any recent travel outside the country. No recent hospitalization or antibiotic use. No history of abdominal surgery.   ROS: See HPI Constitutional: no fever  Eyes: no drainage  ENT: no runny nose   Cardiovascular:  chest pain  Resp: no SOB  GI: vomiting GU: no dysuria Integumentary: no rash  Allergy: no hives  Musculoskeletal: no leg swelling  Neurological: no slurred speech ROS otherwise negative  PAST MEDICAL HISTORY/PAST SURGICAL HISTORY:  History reviewed. No pertinent past medical history.  MEDICATIONS:  Prior to Admission medications   Medication Sig Start Date End Date Taking? Authorizing Provider  HYDROcodone-acetaminophen (NORCO/VICODIN) 5-325 MG per tablet Take 1-2 tablets by mouth every 4 (four) hours as needed. 11/21/14   Elpidio Anis, PA-C  ibuprofen (ADVIL,MOTRIN) 800 MG tablet Take 1 tablet (800 mg total) by mouth 3 (three) times daily. 11/21/14   Elpidio Anis, PA-C    ALLERGIES:  No Known Allergies  SOCIAL HISTORY:  Social History  Substance Use Topics  . Smoking status: Current Every Day Smoker -- 0.50 packs/day    Types: Cigarettes  . Smokeless tobacco: Never Used  . Alcohol Use: Yes      Comment: on weekends    FAMILY HISTORY: History reviewed. No pertinent family history.  EXAM: BP 137/92 mmHg  Pulse 93  Temp(Src) 99 F (37.2 C) (Oral)  Resp 22  SpO2 100%   CONSTITUTIONAL: Alert and oriented and responds appropriately to questions. Appears uncomfortable but is nontoxic appearing HEAD: Normocephalic EYES: Conjunctivae clear, PERRL ENT: normal nose; no rhinorrhea; dry mucous membranes; pharynx without lesions noted NECK: Supple, no meningismus, no LAD  CARD: RRR; S1 and S2 appreciated; no murmurs, no clicks, no rubs, no gallops RESP: Normal chest excursion without splinting or tachypnea; breath sounds clear and equal bilaterally; no wheezes, no rhonchi, no rales, no hypoxia or respiratory distress, speaking full sentences ABD/GI: Normal bowel sounds; minimally tender to palpation diffusely; soft, non-tender, no rebound, no guarding, no peritoneal signs BACK:  The back appears normal and is non-tender to palpation, there is no CVA tenderness EXT: Normal ROM in all joints; non-tender to palpation; no edema; normal capillary refill; no cyanosis, no calf tenderness or swelling    SKIN: Normal color for age and race; warm NEURO: Moves all extremities equally, sensation to light touch intact diffusely, cranial nerves II through XII intact PSYCH: The patient's mood and manner are appropriate. Grooming and personal hygiene are appropriate.  MEDICAL DECISION MAKING: Patient here with diffuse abdominal pain, vomiting and diarrhea. Reports symptoms started after eating around 7 PM. Abdominal exam is benign. Doubt appendicitis, cholecystitis. Suspect this is viral gastroenteritis. He is complaining of atypical chest pain. EKG shows no ischemic changes. We'll obtain chest x-ray, abdominal x-ray. We'll give IV fluids,  Bentyl, Toradol, Zofran, Imodium. Labs, urine pending. He also describes an episode of both of his hands tingling in his lips tingling prior to arrival. Discussed with  patient that I do not think this was a stroke given it was bilateral and he has no risk factors for the same other than tobacco use. The face was involved the do not think this is coming from his neck and he is not complaining of neck pain. These symptoms are gone and he is now neurologically intact.  No headache. Not on anticoagulation.  ED PROGRESS: 2:30 AM  On reevaluation patient states his pain is worse. His lipase is mildly elevated and he has a leukocytosis of 19,000 with left shift. Oral temperature of 99. He still diffusely tender throughout the abdomen but now has involuntary guarding in the right lower quadrant. Reports his most of his pain is in the central abdomen. We'll obtain CT scanning for further evaluation for possible appendicitis, colitis, diverticulitis, pancreatitis. We'll give morphine for pain.   4:10 AM  CT scan shows prominent appendix measuring 8-10 mm but no surrounding inflammation. Appendix did measure 8 mm of 4 years previously on CT scan in this appearance favors normal variant appendiceal thickening over acute appendicitis. Discussed with Dr. Lindie SpruceWyatt with surgery who has reviewed imaging. He agrees that this is likely more consistent with viral gastroenteritis. It is surprising that patient has significant leukocytosis but no obvious signs of acute appendicitis on CT scan. Patient's repeat abdominal exam after morphine is completely benign. Dr. Lindie SpruceWyatt recommends keeping patient on liquid diet and follow-up in 24 hours. I have discussed this with patient and his significant other at bedside agree with this plan. Discussed with patient if he has any worsening pain, fevers, vomiting that will not stop he should return to the emergency department sooner. Discussed with patient that he needs to return in 24 hours for recheck if symptoms continue. We will fluid challenge patient and reassess.   5:30 AM  Pt's repeat abdominal exam is still benign. He has been able to drink without  further vomiting. Reports feeling much better. Urine shows no infection but he does have large ketones but has received 2 L of IV fluids and is drinking now. Reiterated return precautions and importance of 24-hour follow-up for reevaluation. Patient verbalizes understanding and seems reliable. Will also provide him with outpatient PCP follow-up information. We'll discharge with prescription for Imodium and Zofran. Patient verbalizes understanding and is comfortable with this plan.   6:00 AM  Dr. Lindie SpruceWyatt has seen pt and agrees with above plan.  Appreciate his help.  See his note for further details.    EKG Interpretation  Date/Time:  Saturday October 31 2015 23:54:30 EST Ventricular Rate:  88 PR Interval:  142 QRS Duration: 119 QT Interval:  385 QTC Calculation: 466 R Axis:   74 Text Interpretation:  Sinus rhythm Nonspecific intraventricular conduction delay No old tracing to compare Confirmed by Baani Bober,  DO, Maikayla Beggs ([id number]) on 10/31/2015 11:59:13 PM        I personally performed the services described in this documentation, which was scribed in my presence. The recorded information has been reviewed and is accurate.     Layla MawKristen N Adoni Greenough, DO 11/01/15 [id number]  Layla MawKristen N Shambhavi Salley, DO 11/01/15 [phone]

## 2015-11-01 ENCOUNTER — Emergency Department (HOSPITAL_COMMUNITY): Payer: Self-pay

## 2015-11-01 ENCOUNTER — Encounter (HOSPITAL_COMMUNITY): Payer: Self-pay | Admitting: Radiology

## 2015-11-01 LAB — URINALYSIS, ROUTINE W REFLEX MICROSCOPIC
Bilirubin Urine: NEGATIVE
Glucose, UA: NEGATIVE mg/dL
Hgb urine dipstick: NEGATIVE
Ketones, ur: >80 mg/dL — AB
LEUKOCYTES UA: NEGATIVE
NITRITE: NEGATIVE
PROTEIN: NEGATIVE mg/dL
Specific Gravity, Urine: 1.01 (ref 1.005–1.030)
pH: 8.5 — ABNORMAL HIGH (ref 5.0–8.0)

## 2015-11-01 LAB — COMPREHENSIVE METABOLIC PANEL
ALT: 16 U/L — ABNORMAL LOW (ref 17–63)
ANION GAP: 18 — AB (ref 5–15)
AST: 26 U/L (ref 15–41)
Albumin: 5.1 g/dL — ABNORMAL HIGH (ref 3.5–5.0)
Alkaline Phosphatase: 47 U/L (ref 38–126)
BUN: 10 mg/dL (ref 6–20)
CHLORIDE: 97 mmol/L — AB (ref 101–111)
CO2: 22 mmol/L (ref 22–32)
Calcium: 10.6 mg/dL — ABNORMAL HIGH (ref 8.9–10.3)
Creatinine, Ser: 1.57 mg/dL — ABNORMAL HIGH (ref 0.61–1.24)
GFR, EST NON AFRICAN AMERICAN: 57 mL/min — AB (ref >60)
Glucose, Bld: 160 mg/dL — ABNORMAL HIGH (ref 65–99)
POTASSIUM: 3.4 mmol/L — AB (ref 3.5–5.1)
Sodium: 137 mmol/L (ref 135–145)
Total Bilirubin: 1.2 mg/dL (ref 0.3–1.2)
Total Protein: 8.2 g/dL — ABNORMAL HIGH (ref 6.5–8.1)

## 2015-11-01 LAB — CBC WITH DIFFERENTIAL/PLATELET
Basophils Absolute: 0.2 10*3/uL — ABNORMAL HIGH (ref 0.0–0.1)
Basophils Relative: 1 %
EOS PCT: 1 %
Eosinophils Absolute: 0.2 10*3/uL (ref 0.0–0.7)
HCT: 47 % (ref 39.0–52.0)
Hemoglobin: 16.1 g/dL (ref 13.0–17.0)
LYMPHS ABS: 2.3 10*3/uL (ref 0.7–4.0)
Lymphocytes Relative: 13 %
MCH: 24.7 pg — ABNORMAL LOW (ref 26.0–34.0)
MCHC: 34.3 g/dL (ref 30.0–36.0)
MCV: 72.2 fL — AB (ref 78.0–100.0)
MONO ABS: 0.9 10*3/uL (ref 0.1–1.0)
Monocytes Relative: 5 %
Neutro Abs: 14.4 10*3/uL — ABNORMAL HIGH (ref 1.7–7.7)
Neutrophils Relative %: 80 %
PLATELETS: 228 10*3/uL (ref 150–400)
RBC: 6.51 MIL/uL — AB (ref 4.22–5.81)
RDW: 16.7 % — AB (ref 11.5–15.5)
WBC: 18 10*3/uL — AB (ref 4.0–10.5)

## 2015-11-01 LAB — LIPASE, BLOOD: LIPASE: 67 U/L — AB (ref 11–51)

## 2015-11-01 LAB — ETHANOL

## 2015-11-01 MED ORDER — PROMETHAZINE HCL 25 MG/ML IJ SOLN
25.0000 mg | Freq: Once | INTRAMUSCULAR | Status: AC
  Administered 2015-11-01: 25 mg via INTRAVENOUS
  Filled 2015-11-01: qty 1

## 2015-11-01 MED ORDER — ONDANSETRON HCL 4 MG/2ML IJ SOLN
8.0000 mg | Freq: Once | INTRAMUSCULAR | Status: AC
  Administered 2015-11-01: 8 mg via INTRAVENOUS
  Filled 2015-11-01: qty 4

## 2015-11-01 MED ORDER — SODIUM CHLORIDE 0.9 % IV BOLUS (SEPSIS)
1000.0000 mL | Freq: Once | INTRAVENOUS | Status: AC
  Administered 2015-11-01: 1000 mL via INTRAVENOUS

## 2015-11-01 MED ORDER — LOPERAMIDE HCL 2 MG PO CAPS
2.0000 mg | ORAL_CAPSULE | Freq: Once | ORAL | Status: AC
  Administered 2015-11-01: 2 mg via ORAL
  Filled 2015-11-01: qty 1

## 2015-11-01 MED ORDER — KETOROLAC TROMETHAMINE 30 MG/ML IJ SOLN
30.0000 mg | Freq: Once | INTRAMUSCULAR | Status: AC
  Administered 2015-11-01: 30 mg via INTRAVENOUS
  Filled 2015-11-01: qty 1

## 2015-11-01 MED ORDER — MORPHINE SULFATE (PF) 4 MG/ML IV SOLN
4.0000 mg | Freq: Once | INTRAVENOUS | Status: AC
Start: 2015-11-01 — End: 2015-11-01
  Administered 2015-11-01: 4 mg via INTRAVENOUS
  Filled 2015-11-01: qty 1

## 2015-11-01 MED ORDER — LOPERAMIDE HCL 2 MG PO CAPS: 2.0000 mg | ORAL_CAPSULE | Freq: Four times a day (QID) | ORAL | Status: DC | PRN

## 2015-11-01 MED ORDER — PROMETHAZINE HCL 25 MG PO TABS: 25.0000 mg | ORAL_TABLET | Freq: Four times a day (QID) | ORAL | Status: DC | PRN

## 2015-11-01 MED ORDER — IOHEXOL 300 MG/ML  SOLN
100.0000 mL | Freq: Once | INTRAMUSCULAR | Status: AC | PRN
  Administered 2015-11-01: 100 mL via INTRAVENOUS

## 2015-11-01 MED ORDER — DICYCLOMINE HCL 10 MG/ML IM SOLN
20.0000 mg | Freq: Once | INTRAMUSCULAR | Status: AC
  Administered 2015-11-01: 20 mg via INTRAMUSCULAR
  Filled 2015-11-01: qty 2

## 2015-11-01 NOTE — ED Notes (Signed)
Pt unable to urinate at this time.  

## 2015-11-01 NOTE — Consult Note (Signed)
  Asked to see this otherwise healthy 34 year old male with abdominal pain, elevated WBC count and an enlarged appendix on CT scan.    Associated symptoms were nauseal, vomiting and diarrhea.  He was not tender in the RLQ on examination by the EDP and his CT scan of the abdomen demonstrated an enlarged appendix that was the same size as his appendix on CT 4 years ago.  There were no associated periappendiceal findings of acute appendicitis such as stranding, fluid or air.  I examined the patient approximated 3 hours after hsi last does of pain medication and he is completely asymptomatic, has normal bowel sunds, and completely non-tender in the RLQ.  He has taken PO liquids well.  We have agreed to let him go home with the agreement to comeback if his pain should return or he havs fevers or chills.  His elevated WBC probably from the volume contraction.  I believe that the patient is reliable and will return if his symptoms should worsen.  Marta LamasJames O. Gae BonWyatt, III, MD, FACS [phone]--pager [phone]--office [facility]

## 2015-11-01 NOTE — ED Notes (Signed)
Pt vomiting, unable to complete po challenge at this time

## 2015-11-01 NOTE — ED Notes (Signed)
Pt verbalized understanding of d/c instructions and has no further questions. Pt stable and NAD. Pt d/c home with wife girlfriend driving. Pt understands that the plan is for him to come back if he begins to have abd pain, n/v/d again.

## 2015-11-01 NOTE — Discharge Instructions (Signed)
You were seen in the emergency department for abdominal pain, nausea, vomiting or diarrhea. You likely have a viral illness causing your symptoms. Your CT scan did not show that she had appendicitis today but did show that your appendix was larger then it should be but has been large on a previous CT scan 4 years ago. We have discussed your case with the surgeon on call who agrees that this is unlikely appendicitis but we want you to return to the emergency department 24 hours to be reevaluated. If you have worsening symptoms of pain, vomiting that will not stop, blood in your stool then please return to the emergency department sooner.  Abdominal Pain, Adult Many things can cause abdominal pain. Usually, abdominal pain is not caused by a disease and will improve without treatment. It can often be observed and treated at home. Your health care provider will do a physical exam and possibly order blood tests and X-rays to help determine the seriousness of your pain. However, in many cases, more time must pass before a clear cause of the pain can be found. Before that point, your health care provider may not know if you need more testing or further treatment. HOME CARE INSTRUCTIONS Monitor your abdominal pain for any changes. The following actions may help to alleviate any discomfort you are experiencing:  Only take over-the-counter or prescription medicines as directed by your health care provider.  Do not take laxatives unless directed to do so by your health care provider.  Try a clear liquid diet (broth, tea, or water) as directed by your health care provider. Slowly move to a bland diet as tolerated. SEEK MEDICAL CARE IF:  You have unexplained abdominal pain.  You have abdominal pain associated with nausea or diarrhea.  You have pain when you urinate or have a bowel movement.  You experience abdominal pain that wakes you in the night.  You have abdominal pain that is worsened or improved by  eating food.  You have abdominal pain that is worsened with eating fatty foods.  You have a fever. SEEK IMMEDIATE MEDICAL CARE IF:  Your pain does not go away within 2 hours.  You keep throwing up (vomiting).  Your pain is felt only in portions of the abdomen, such as the right side or the left lower portion of the abdomen.  You pass bloody or black tarry stools. MAKE SURE YOU:  Understand these instructions.  Will watch your condition.  Will get help right away if you are not doing well or get worse.   This information is not intended to replace advice given to you by your health care provider. Make sure you discuss any questions you have with your health care provider.   Document Released: 08/17/2005 Document Revised: 07/29/2015 Document Reviewed: 07/17/2013 Elsevier Interactive Patient Education 2016 [employer]  Diarrhea Diarrhea is frequent loose and watery bowel movements. It can cause you to feel weak and dehydrated. Dehydration can cause you to become tired and thirsty, have a dry mouth, and have decreased urination that often is dark yellow. Diarrhea is a sign of another problem, most often an infection that will not last long. In most cases, diarrhea typically lasts 2-3 days. However, it can last longer if it is a sign of something more serious. It is important to treat your diarrhea as directed by your caregiver to lessen or prevent future episodes of diarrhea. CAUSES  Some common causes include:  Gastrointestinal infections caused by viruses, bacteria, or parasites.  Food poisoning or food allergies.  Certain medicines, such as antibiotics, chemotherapy, and laxatives.  Artificial sweeteners and fructose.  Digestive disorders. HOME CARE INSTRUCTIONS  Ensure adequate fluid intake (hydration): Have 1 cup (8 oz) of fluid for each diarrhea episode. Avoid fluids that contain simple sugars or sports drinks, fruit juices, whole milk products, and sodas. Your urine  should be clear or pale yellow if you are drinking enough fluids. Hydrate with an oral rehydration solution that you can purchase at pharmacies, retail stores, and online. You can prepare an oral rehydration solution at home by mixing the following ingredients together:   - tsp table salt.   tsp baking soda.   tsp salt substitute containing potassium chloride.  1  tablespoons sugar.  1 L (34 oz) of water.  Certain foods and beverages may increase the speed at which food moves through the gastrointestinal (GI) tract. These foods and beverages should be avoided and include:  Caffeinated and alcoholic beverages.  High-fiber foods, such as raw fruits and vegetables, nuts, seeds, and whole grain breads and cereals.  Foods and beverages sweetened with sugar alcohols, such as xylitol, sorbitol, and mannitol.  Some foods may be well tolerated and may help thicken stool including:  Starchy foods, such as rice, toast, pasta, low-sugar cereal, oatmeal, grits, baked potatoes, crackers, and bagels.  Bananas.  Applesauce.  Add probiotic-rich foods to help increase healthy bacteria in the GI tract, such as yogurt and fermented milk products.  Wash your hands well after each diarrhea episode.  Only take over-the-counter or prescription medicines as directed by your caregiver.  Take a warm bath to relieve any burning or pain from frequent diarrhea episodes. SEEK IMMEDIATE MEDICAL CARE IF:   You are unable to keep fluids down.  You have persistent vomiting.  You have blood in your stool, or your stools are black and tarry.  You do not urinate in 6-8 hours, or there is only a small amount of very dark urine.  You have abdominal pain that increases or localizes.  You have weakness, dizziness, confusion, or light-headedness.  You have a severe headache.  Your diarrhea gets worse or does not get better.  You have a fever or persistent symptoms for more than 2-3 days.  You have a  fever and your symptoms suddenly get worse. MAKE SURE YOU:   Understand these instructions.  Will watch your condition.  Will get help right away if you are not doing well or get worse.   This information is not intended to replace advice given to you by your health care provider. Make sure you discuss any questions you have with your health care provider.   Document Released: 10/28/2002 Document Revised: 11/28/2014 Document Reviewed: 07/15/2012 Elsevier Interactive Patient Education 2016 Elsevier Inc.  Nausea and Vomiting Nausea is a sick feeling that often comes before throwing up (vomiting). Vomiting is a reflex where stomach contents come out of your mouth. Vomiting can cause severe loss of body fluids (dehydration). Children and elderly adults can become dehydrated quickly, especially if they also have diarrhea. Nausea and vomiting are symptoms of a condition or disease. It is important to find the cause of your symptoms. CAUSES   Direct irritation of the stomach lining. This irritation can result from increased acid production (gastroesophageal reflux disease), infection, food poisoning, taking certain medicines (such as nonsteroidal anti-inflammatory drugs), alcohol use, or tobacco use.  Signals from the brain.These signals could be caused by a headache, heat exposure, an inner ear disturbance,  increased pressure in the brain from injury, infection, a tumor, or a concussion, pain, emotional stimulus, or metabolic problems.  An obstruction in the gastrointestinal tract (bowel obstruction).  Illnesses such as diabetes, hepatitis, gallbladder problems, appendicitis, kidney problems, cancer, sepsis, atypical symptoms of a heart attack, or eating disorders.  Medical treatments such as chemotherapy and radiation.  Receiving medicine that makes you sleep (general anesthetic) during surgery. DIAGNOSIS Your caregiver may ask for tests to be done if the problems do not improve after a  few days. Tests may also be done if symptoms are severe or if the reason for the nausea and vomiting is not clear. Tests may include:  Urine tests.  Blood tests.  Stool tests.  Cultures (to look for evidence of infection).  X-rays or other imaging studies. Test results can help your caregiver make decisions about treatment or the need for additional tests. TREATMENT You need to stay well hydrated. Drink frequently but in small amounts.You may wish to drink water, sports drinks, clear broth, or eat frozen ice pops or gelatin dessert to help stay hydrated.When you eat, eating slowly may help prevent nausea.There are also some antinausea medicines that may help prevent nausea. HOME CARE INSTRUCTIONS   Take all medicine as directed by your caregiver.  If you do not have an appetite, do not force yourself to eat. However, you must continue to drink fluids.  If you have an appetite, eat a normal diet unless your caregiver tells you differently.  Eat a variety of complex carbohydrates (rice, wheat, potatoes, bread), lean meats, yogurt, fruits, and vegetables.  Avoid high-fat foods because they are more difficult to digest.  Drink enough water and fluids to keep your urine clear or pale yellow.  If you are dehydrated, ask your caregiver for specific rehydration instructions. Signs of dehydration may include:  Severe thirst.  Dry lips and mouth.  Dizziness.  Dark urine.  Decreasing urine frequency and amount.  Confusion.  Rapid breathing or pulse. SEEK IMMEDIATE MEDICAL CARE IF:   You have blood or brown flecks (like coffee grounds) in your vomit.  You have black or bloody stools.  You have a severe headache or stiff neck.  You are confused.  You have severe abdominal pain.  You have chest pain or trouble breathing.  You do not urinate at least once every 8 hours.  You develop cold or clammy skin.  You continue to vomit for longer than 24 to 48 hours.  You  have a fever. MAKE SURE YOU:   Understand these instructions.  Will watch your condition.  Will get help right away if you are not doing well or get worse.   This information is not intended to replace advice given to you by your health care provider. Make sure you discuss any questions you have with your health care provider.   Document Released: 11/07/2005 Document Revised: 01/30/2012 Document Reviewed: 04/06/2011 Elsevier Interactive Patient Education [employer]

## 2016-09-05 ENCOUNTER — Encounter (HOSPITAL_COMMUNITY): Payer: Self-pay | Admitting: *Deleted

## 2016-09-05 ENCOUNTER — Emergency Department (HOSPITAL_COMMUNITY)
Admission: EM | Admit: 2016-09-05 | Discharge: 2016-09-05 | Disposition: A | Discharge status: Home / Self Care | Payer: Self-pay | Attending: Emergency Medicine | Admitting: Emergency Medicine

## 2016-09-05 ENCOUNTER — Emergency Department (HOSPITAL_COMMUNITY): Payer: Self-pay

## 2016-09-05 DIAGNOSIS — R112 Nausea with vomiting, unspecified: Secondary | ICD-10-CM | POA: Insufficient documentation

## 2016-09-05 DIAGNOSIS — F17210 Nicotine dependence, cigarettes, uncomplicated: Secondary | ICD-10-CM | POA: Insufficient documentation

## 2016-09-05 DIAGNOSIS — J45909 Unspecified asthma, uncomplicated: Secondary | ICD-10-CM | POA: Insufficient documentation

## 2016-09-05 DIAGNOSIS — R1013 Epigastric pain: Secondary | ICD-10-CM

## 2016-09-05 LAB — URINALYSIS, ROUTINE W REFLEX MICROSCOPIC
BILIRUBIN URINE: NEGATIVE
GLUCOSE, UA: NEGATIVE mg/dL
Hgb urine dipstick: NEGATIVE
KETONES UR: 15 mg/dL — AB
Leukocytes, UA: NEGATIVE
NITRITE: NEGATIVE
PH: 8.5 — AB (ref 5.0–8.0)
Protein, ur: NEGATIVE mg/dL
SPECIFIC GRAVITY, URINE: 1.023 (ref 1.005–1.030)

## 2016-09-05 LAB — CBC
HEMATOCRIT: 42.5 % (ref 39.0–52.0)
HEMOGLOBIN: 14.7 g/dL (ref 13.0–17.0)
MCH: 24.4 pg — ABNORMAL LOW (ref 26.0–34.0)
MCHC: 34.6 g/dL (ref 30.0–36.0)
MCV: 70.6 fL — AB (ref 78.0–100.0)
Platelets: 235 10*3/uL (ref 150–400)
RBC: 6.02 MIL/uL — ABNORMAL HIGH (ref 4.22–5.81)
RDW: 16 % — AB (ref 11.5–15.5)
WBC: 10.1 10*3/uL (ref 4.0–10.5)

## 2016-09-05 LAB — COMPREHENSIVE METABOLIC PANEL
ALBUMIN: 4.6 g/dL (ref 3.5–5.0)
ALT: 17 U/L (ref 17–63)
ANION GAP: 10 (ref 5–15)
AST: 21 U/L (ref 15–41)
Alkaline Phosphatase: 38 U/L (ref 38–126)
BILIRUBIN TOTAL: 0.9 mg/dL (ref 0.3–1.2)
BUN: 6 mg/dL (ref 6–20)
CHLORIDE: 106 mmol/L (ref 101–111)
CO2: 22 mmol/L (ref 22–32)
Calcium: 10.5 mg/dL — ABNORMAL HIGH (ref 8.9–10.3)
Creatinine, Ser: 1.19 mg/dL (ref 0.61–1.24)
GFR calc Af Amer: >60 mL/min (ref >60)
GFR calc non Af Amer: >60 mL/min (ref >60)
GLUCOSE: 109 mg/dL — AB (ref 65–99)
POTASSIUM: 4.2 mmol/L (ref 3.5–5.1)
SODIUM: 138 mmol/L (ref 135–145)
TOTAL PROTEIN: 7.5 g/dL (ref 6.5–8.1)

## 2016-09-05 LAB — LIPASE, BLOOD: LIPASE: 116 U/L — AB (ref 11–51)

## 2016-09-05 LAB — CBG MONITORING, ED: GLUCOSE-CAPILLARY: 86 mg/dL (ref 65–99)

## 2016-09-05 MED ORDER — ONDANSETRON 4 MG PO TBDP
4.0000 mg | ORAL_TABLET | Freq: Once | ORAL | Status: AC | PRN
  Administered 2016-09-05: 4 mg via ORAL

## 2016-09-05 MED ORDER — SODIUM CHLORIDE 0.9 % IV BOLUS (SEPSIS)
1000.0000 mL | Freq: Once | INTRAVENOUS | Status: AC
  Administered 2016-09-05: 1000 mL via INTRAVENOUS

## 2016-09-05 MED ORDER — DICYCLOMINE HCL 20 MG PO TABS: 20.0000 mg | ORAL_TABLET | Freq: Two times a day (BID) | ORAL | 0 refills | Status: AC

## 2016-09-05 MED ORDER — ONDANSETRON 4 MG PO TBDP: 4.0000 mg | ORAL_TABLET | Freq: Three times a day (TID) | ORAL | 0 refills | Status: AC | PRN

## 2016-09-05 MED ORDER — GI COCKTAIL ~~LOC~~
30.0000 mL | Freq: Once | ORAL | Status: AC
  Administered 2016-09-05: 30 mL via ORAL
  Filled 2016-09-05: qty 30

## 2016-09-05 MED ORDER — IOPAMIDOL (ISOVUE-300) INJECTION 61%
INTRAVENOUS | Status: AC
  Administered 2016-09-05: 100 mL
  Filled 2016-09-05: qty 100

## 2016-09-05 MED ORDER — HYDROMORPHONE HCL 1 MG/ML IJ SOLN
0.5000 mg | Freq: Once | INTRAMUSCULAR | Status: AC
  Administered 2016-09-05: 0.5 mg via INTRAVENOUS
  Filled 2016-09-05: qty 1

## 2016-09-05 MED ORDER — ONDANSETRON 4 MG PO TBDP
ORAL_TABLET | ORAL | Status: AC
  Filled 2016-09-05: qty 1

## 2016-09-05 MED ORDER — PROMETHAZINE HCL 25 MG/ML IJ SOLN
25.0000 mg | Freq: Once | INTRAMUSCULAR | Status: AC
  Administered 2016-09-05: 25 mg via INTRAVENOUS
  Filled 2016-09-05: qty 1

## 2016-09-05 MED ORDER — HYDROMORPHONE HCL 1 MG/ML IJ SOLN
1.0000 mg | Freq: Once | INTRAMUSCULAR | Status: AC
Start: 2016-09-05 — End: 2016-09-05
  Administered 2016-09-05: 1 mg via INTRAVENOUS
  Filled 2016-09-05: qty 1

## 2016-09-05 NOTE — Discharge Instructions (Signed)
Your CT scan shows no acute cause for your abdominal pain and vomiting including small bowel obstruction, appendicitis, or disease of your liver, gall bladder, or pancreas.Please take the prescribed Bentyl and Zofran for relief of your abdominal pain and nausea/vomiting. Please call [facility] in 2 days to establish care and follow-up with primary care provider. Also, please stop smoking.

## 2016-09-05 NOTE — ED Provider Notes (Signed)
[facility]-EMERGENCY DEPT Provider Note   CSN: [id number] Arrival date & time: 09/05/16  1259     History   Chief Complaint Chief Complaint  Patient presents with  . Abdominal Pain  . Emesis    HPI Edward Barrera is a 35 y.o. male.  Patient is 35 yo M with PMH of asthma, no surgical history, presenting with chief complaint of epigastric abdominal pain starting at 11 AM yesterday morning. States pain has been worsening since, and he describes the pain as "like getting shot in the chest." Also reports nausea and vomiting, with last episode of vomiting "10 minutes ago." Denies any hematemesis. No aggravating or relieving factors, and states the Zofran he received while waiting in Triage did not help. Last PO intake was 10 PM last night. Last BM was about an hour ago, which he describes as regular. Denies melena or hematochezia. Denies any fever, chills, chest pain, or shortness of breath. Denies any chronic GI problems, but was evaluated on 10/31/2015 for episode of N/V/D and had CT scan which showed prominent appendix measuring 8-10 mm but no surrounding inflammation, with similar measurement 4 years prior. States he last drank alcohol 1 week ago, smokes cigarettes daily, but denies any drug use.      Past Medical History:  Diagnosis Date  . Asthma     There are no active problems to display for this patient.   Past Surgical History:  Procedure Laterality Date  . I&D EXTREMITY  04/29/2012   Procedure: IRRIGATION AND DEBRIDEMENT EXTREMITY;  Surgeon: Dominica Severin, MD;  Location: [facility];  Service: Orthopedics;  Laterality: Right;       Home Medications    Prior to Admission medications   Medication Sig Start Date End Date Taking? Authorizing Provider  acetaminophen (TYLENOL) 325 MG tablet Take 650 mg by mouth every 6 (six) hours as needed for mild pain.   Yes Historical Provider, MD  loperamide (IMODIUM) 2 MG capsule Take 1 capsule (2 mg total) by mouth 4 (four) times daily as  needed for diarrhea or loose stools. Patient not taking: Reported on 09/05/2016 11/01/15   Kristen N Ward, DO  promethazine (PHENERGAN) 25 MG tablet Take 1 tablet (25 mg total) by mouth every 6 (six) hours as needed for nausea or vomiting. Patient not taking: Reported on 09/05/2016 11/01/15   Layla Maw Ward, DO    Family History No family history on file.  Social History Social History  Substance Use Topics  . Smoking status: Current Every Day Smoker    Packs/day: 0.50    Types: Cigarettes  . Smokeless tobacco: Never Used  . Alcohol use Yes     Comment: on weekends     Allergies   Review of patient's allergies indicates no known allergies.   Review of Systems Review of Systems  Constitutional: Negative for chills and fever.  HENT: Negative for ear pain and sore throat.   Eyes: Negative for pain and visual disturbance.  Respiratory: Negative for cough and shortness of breath.   Cardiovascular: Negative for chest pain, palpitations and leg swelling.  Gastrointestinal: Positive for abdominal pain, nausea and vomiting. Negative for blood in stool and diarrhea.  Genitourinary: Negative for dysuria, flank pain and hematuria.  Musculoskeletal: Negative for back pain and neck pain.  Skin: Negative for color change and rash.  Neurological: Negative for dizziness, seizures, syncope, weakness, numbness and headaches.     Physical Exam Updated Vital Signs BP 122/76   Pulse (!) 52  Temp 97.8 F (36.6 C) (Oral)   Resp 12   SpO2 100%   Physical Exam  Constitutional: He appears well-developed and well-nourished.  Obese male, writhing in pain in stretcher, requesting pain meds and a pillow. Non-toxic appearance.  HENT:  Head: Normocephalic and atraumatic.  Mouth/Throat: Oropharynx is clear and moist.  Eyes: Conjunctivae are normal.  Neck: Normal range of motion.  Cardiovascular: Normal rate, regular rhythm, normal heart sounds and intact distal pulses.   Pulmonary/Chest:  Effort normal and breath sounds normal. No respiratory distress.  Abdominal: Soft. Bowel sounds are normal. He exhibits no distension. There is no guarding.  Patient states pain is diffusely TTP. Negative Murphy's.  Musculoskeletal: Normal range of motion. He exhibits no edema or tenderness.  Neurological: He is alert.  Skin: Skin is warm and dry.  Psychiatric: He has a normal mood and affect.  Nursing note and vitals reviewed.    ED Treatments / Results  Labs (all labs ordered are listed, but only abnormal results are displayed) Labs Reviewed  LIPASE, BLOOD - Abnormal; Notable for the following:       Result Value   Lipase 116 (*)    All other components within normal limits  COMPREHENSIVE METABOLIC PANEL - Abnormal; Notable for the following:    Glucose, Bld 109 (*)    Calcium 10.5 (*)    All other components within normal limits  CBC - Abnormal; Notable for the following:    RBC 6.02 (*)    MCV 70.6 (*)    MCH 24.4 (*)    RDW 16.0 (*)    All other components within normal limits  URINALYSIS, ROUTINE W REFLEX MICROSCOPIC (NOT AT [facility])  CBG MONITORING, ED    EKG  EKG Interpretation  Date/Time:  Monday September 05 2016 13:25:25 EDT Ventricular Rate:  68 PR Interval:  138 QRS Duration: 108 QT Interval:  406 QTC Calculation: 431 R Axis:   75 Text Interpretation:  Normal sinus rhythm with sinus arrhythmia Normal ECG NO STEMI  Similar to prior.  Confirmed by Rush Landmark MD, CHRISTOPHER [phone]) on 09/05/2016 1:42:49 PM       Radiology No results found.  Procedures Procedures (including critical care time)  Medications Ordered in ED Medications  ondansetron (ZOFRAN-ODT) 4 MG disintegrating tablet (not administered)  sodium chloride 0.9 % bolus 1,000 mL (not administered)  HYDROmorphone (DILAUDID) injection 1 mg (not administered)  promethazine (PHENERGAN) injection 25 mg (not administered)  ondansetron (ZOFRAN-ODT) disintegrating tablet 4 mg (4 mg Oral Given 09/05/16  1330)     Initial Impression / Assessment and Plan / ED Course  I have reviewed the triage vital signs and the nursing notes.  Pertinent labs & imaging results that were available during my care of the patient were reviewed by me and considered in my medical decision making (see chart for details).  Clinical Course   Patient is 35 yo M presenting with chief complaint of epigastric abdominal pain and vomiting starting at 11 AM yesterday morning. Labs including CBG, CBC, CMP, and urinalysis unremarkable. Lipase mildly elevated at 116, but CT abdomen reveals no bowel obstruction of inflammation concerning for pancreatitis or other acute abdominal pathology. Evidence of colonic spasm on CT, and pain control achieved with IV dilaudid and GI cocktail. Stable for d/c home with prescription Zofran and Bentyl, as well as contact information to establish care and f/u with [facility]. Smoking cessation counseling provided. Strict return precautions discussed for worsening symptoms including fever, abdominal pain, vomiting,  or blood in stools.  Final Clinical Impressions(s) / ED Diagnoses   Final diagnoses:  Epigastric pain  Non-intractable vomiting with nausea, unspecified vomiting type    New Prescriptions Discharge Medication List as of 09/05/2016 10:05 PM    START taking these medications   Details  dicyclomine (BENTYL) 20 MG tablet Take 1 tablet (20 mg total) by mouth 2 (two) times daily., Starting Mon 09/05/2016, Print    ondansetron (ZOFRAN ODT) 4 MG disintegrating tablet Take 1 tablet (4 mg total) by mouth every 8 (eight) hours as needed for nausea or vomiting., Starting Mon 09/05/2016, Print         Terry Bolotin F de Morris[place] II, [place] 09/06/16 0330    Shaune Pollackameron Isaacs, MD 09/06/16 1339

## 2016-09-05 NOTE — ED Triage Notes (Addendum)
PT is here with upper abdominal pain and vomiting. No diarrhea or constipation. Pt states last time he was here it was about his pancreas.  Pt states he feels like he is going to pass out.  VSS

## 2016-09-07 ENCOUNTER — Encounter (HOSPITAL_COMMUNITY): Payer: Self-pay

## 2016-09-07 ENCOUNTER — Emergency Department (HOSPITAL_COMMUNITY): Payer: Self-pay

## 2016-09-07 ENCOUNTER — Emergency Department (HOSPITAL_COMMUNITY)
Admission: EM | Admit: 2016-09-07 | Discharge: 2016-09-07 | Disposition: A | Discharge status: Home / Self Care | Payer: Self-pay | Attending: Emergency Medicine | Admitting: Emergency Medicine

## 2016-09-07 DIAGNOSIS — R1013 Epigastric pain: Secondary | ICD-10-CM | POA: Insufficient documentation

## 2016-09-07 DIAGNOSIS — F17210 Nicotine dependence, cigarettes, uncomplicated: Secondary | ICD-10-CM | POA: Insufficient documentation

## 2016-09-07 DIAGNOSIS — J45909 Unspecified asthma, uncomplicated: Secondary | ICD-10-CM | POA: Insufficient documentation

## 2016-09-07 LAB — CBC WITH DIFFERENTIAL/PLATELET
BASOS ABS: 0 10*3/uL (ref 0.0–0.1)
BASOS PCT: 0 %
EOS ABS: 0 10*3/uL (ref 0.0–0.7)
EOS PCT: 0 %
HCT: 39.7 % (ref 39.0–52.0)
Hemoglobin: 13.4 g/dL (ref 13.0–17.0)
Lymphocytes Relative: 21 %
Lymphs Abs: 1.6 10*3/uL (ref 0.7–4.0)
MCH: 24 pg — ABNORMAL LOW (ref 26.0–34.0)
MCHC: 33.8 g/dL (ref 30.0–36.0)
MCV: 71.1 fL — ABNORMAL LOW (ref 78.0–100.0)
MONO ABS: 0.6 10*3/uL (ref 0.1–1.0)
MONOS PCT: 8 %
Neutro Abs: 5.5 10*3/uL (ref 1.7–7.7)
Neutrophils Relative %: 71 %
PLATELETS: 225 10*3/uL (ref 150–400)
RBC: 5.58 MIL/uL (ref 4.22–5.81)
RDW: 15.9 % — AB (ref 11.5–15.5)
WBC: 7.7 10*3/uL (ref 4.0–10.5)

## 2016-09-07 LAB — LIPASE, BLOOD: Lipase: 43 U/L (ref 11–51)

## 2016-09-07 LAB — COMPREHENSIVE METABOLIC PANEL
ALBUMIN: 4.1 g/dL (ref 3.5–5.0)
ALK PHOS: 31 U/L — AB (ref 38–126)
ALT: 14 U/L — ABNORMAL LOW (ref 17–63)
ANION GAP: 8 (ref 5–15)
AST: 18 U/L (ref 15–41)
BUN: 11 mg/dL (ref 6–20)
CALCIUM: 8.7 mg/dL — AB (ref 8.9–10.3)
CO2: 23 mmol/L (ref 22–32)
Chloride: 106 mmol/L (ref 101–111)
Creatinine, Ser: 0.99 mg/dL (ref 0.61–1.24)
GFR calc non Af Amer: >60 mL/min (ref >60)
GLUCOSE: 101 mg/dL — AB (ref 65–99)
POTASSIUM: 3.6 mmol/L (ref 3.5–5.1)
SODIUM: 137 mmol/L (ref 135–145)
TOTAL PROTEIN: 7 g/dL (ref 6.5–8.1)
Total Bilirubin: 0.9 mg/dL (ref 0.3–1.2)

## 2016-09-07 MED ORDER — SODIUM CHLORIDE 0.9 % IV BOLUS (SEPSIS)
1000.0000 mL | Freq: Once | INTRAVENOUS | Status: AC
  Administered 2016-09-07: 1000 mL via INTRAVENOUS

## 2016-09-07 MED ORDER — HYDROCODONE-ACETAMINOPHEN 5-325 MG PO TABS: 1.0000 | ORAL_TABLET | Freq: Four times a day (QID) | ORAL | 0 refills | Status: DC | PRN

## 2016-09-07 MED ORDER — ONDANSETRON HCL 4 MG/2ML IJ SOLN
4.0000 mg | Freq: Once | INTRAMUSCULAR | Status: AC
  Administered 2016-09-07: 4 mg via INTRAVENOUS
  Filled 2016-09-07: qty 2

## 2016-09-07 MED ORDER — HYDROMORPHONE HCL 1 MG/ML IJ SOLN
1.0000 mg | Freq: Once | INTRAMUSCULAR | Status: AC
  Administered 2016-09-07: 1 mg via INTRAVENOUS
  Filled 2016-09-07: qty 1

## 2016-09-07 NOTE — ED Notes (Signed)
Pt tolerated ginger ale.  

## 2016-09-07 NOTE — ED Provider Notes (Signed)
Hand-off from United Auto[employer]t Browning, PA-C. Pending labs.   See initial note for full HPI.  Briefly pt is a 35 yo male with PMH of asthma, CHF, DM, HTN who presents to the ED with complaint of epigastric pain. Endorses associated nausea and vomiting. Denies fever, chills, chest pain, shortness of breath, hematemesis, diarrhea, constipation, urinary symptoms. Patient reports history of alcohol abuse but notes he last drink alcohol week ago.  Physical Exam  BP (!) 146/101 (BP Location: Right Arm)   Pulse 62   Temp 98 F (36.7 C) (Oral)   Resp 18   Ht 6\' 2"  (1.88 m)   Wt 113.4 kg   SpO2 100%   BMI 32.10 kg/m   Physical Exam  Constitutional: He is oriented to person, place, and time. He appears well-developed and well-nourished. No distress.  HENT:  Head: Normocephalic and atraumatic.  Mouth/Throat: Oropharynx is clear and moist. No oropharyngeal exudate.  Eyes: Conjunctivae and EOM are normal. Right eye exhibits no discharge. Left eye exhibits no discharge. No scleral icterus.  Neck: Normal range of motion. Neck supple.  Cardiovascular: Normal rate, regular rhythm, normal heart sounds and intact distal pulses.   Pulmonary/Chest: Effort normal and breath sounds normal. No respiratory distress. He has no wheezes. He has no rales. He exhibits no tenderness.  Abdominal: Soft. Bowel sounds are normal. He exhibits no distension and no mass. There is tenderness (mild TTP over epigatric region). There is no rebound and no guarding. No hernia.  Musculoskeletal: Normal range of motion. He exhibits no edema.  Neurological: He is alert and oriented to person, place, and time.  Skin: Skin is warm and dry. He is not diaphoretic.  Nursing note and vitals reviewed.   ED Course  Procedures  MDM Patient presents with epigastric abdominal pain with associated nausea and vomiting. Chart review shows patient was seen in the ED 2 days ago for similar abdominal pain. CT abdomen showed colonic spasm but no acute  surgical processes. Lipase was noted to be mildly elevated at that time. Patient was discharged home with symptomatic treatment. VSS. Evaluation performed by initial provider revealed tenderness over epigastric and right upper quadrant. Right upper quadrant ultrasound was normal. Patient given IV fluids and pain meds in the ED. Labs pending for further evaluation of pancreatitis. If elevated lipase, plan to PO challenge and discharge patient home with pain meds; if lipase improved plan to discharge patient home with symptomatic medications he received in the ED 2 days ago.  Lipase improved to 43, last lipase 116 two days ago. Remaining labs unremarkable. VSS. On my initial evaluation the pt is resting laying in bed. Endorses having continued pain to his abdomen, denies any episodes of vomiting since arrival to the ED. Exam revealed TTP over epigastric region, no peritoneal signs. Remaining exam unremarkable. Plan to PO challenge pt and d/c home with symptomatic tx including continued clear fluid rehydration and refraining from drinking alcohol. Pt able to tolerate PO. Pt d/c home with GI follow up as needed. Discussed return precautions.        Satira Sarkicole Elizabeth [place] Market[place]adeau, [place]-C 09/07/16 1654    Jacalyn LefevreJulie Haviland, MD 09/08/16 1319

## 2016-09-07 NOTE — ED Notes (Signed)
Unable to collect labs ultrasound is in the room with patient

## 2016-09-07 NOTE — ED Triage Notes (Signed)
Pt here with abdominal pain.  At [facility] 2 days ago.  Pt states "they didn't do anything".  Pain continues.  N/V with no diarrhea.  No problems with urination.

## 2016-09-07 NOTE — ED Provider Notes (Signed)
[facility]-EMERGENCY DEPT Provider Note   CSN: [id number] Arrival date & time: 09/07/16  1126     History   Chief Complaint Chief Complaint  Patient presents with  . Abdominal Pain    HPI Edward Barrera is a 35 y.o. male.  Patient presents to the emergency department with chief complaint of epigastric abdominal pain. He states that his symptoms have been unchanged since 2 days ago. He has not been entirely keep anything down. He reports persistent vomiting. He states that he used to drink alcohol heavily, but now only has a couple drinks a week. He denies any fevers or chills. He denies any chest pain or shortness of breath. He has tried taking the Bentyl and antiemetic with no relief. He denies any other associated symptoms. There are no modifying factors. He denies any prior abdominal surgeries.   The history is provided by the patient. No language interpreter was used.    Past Medical History:  Diagnosis Date  . Asthma     There are no active problems to display for this patient.   Past Surgical History:  Procedure Laterality Date  . I&D EXTREMITY  04/29/2012   Procedure: IRRIGATION AND DEBRIDEMENT EXTREMITY;  Surgeon: Dominica Severin, MD;  Location: [facility];  Service: Orthopedics;  Laterality: Right;       Home Medications    Prior to Admission medications   Medication Sig Start Date End Date Taking? Authorizing Provider  acetaminophen (TYLENOL) 325 MG tablet Take 650 mg by mouth every 6 (six) hours as needed for mild pain.   Yes Historical Provider, MD  dicyclomine (BENTYL) 20 MG tablet Take 1 tablet (20 mg total) by mouth 2 (two) times daily. 09/05/16  Yes Daryl F de Villier II, PA  ondansetron (ZOFRAN ODT) 4 MG disintegrating tablet Take 1 tablet (4 mg total) by mouth every 8 (eight) hours as needed for nausea or vomiting. 09/05/16  Yes Daryl F de Villier II, PA    Family History History reviewed. No pertinent family history.  Social History Social History    Substance Use Topics  . Smoking status: Current Every Day Smoker    Packs/day: 0.50    Types: Cigarettes  . Smokeless tobacco: Never Used  . Alcohol use Yes     Comment: on weekends     Allergies   Review of patient's allergies indicates no known allergies.   Review of Systems Review of Systems  All other systems reviewed and are negative.    Physical Exam Updated Vital Signs BP 145/98   Pulse 62   Temp 98 F (36.7 C) (Oral)   Resp 18   Ht 6\' 2"  (1.88 m)   Wt 113.4 kg   SpO2 100%   BMI 32.10 kg/m   Physical Exam  Constitutional: He is oriented to person, place, and time. He appears well-developed and well-nourished.  HENT:  Head: Normocephalic and atraumatic.  Eyes: Conjunctivae and EOM are normal. Pupils are equal, round, and reactive to light. Right eye exhibits no discharge. Left eye exhibits no discharge. No scleral icterus.  Neck: Normal range of motion. Neck supple. No JVD present.  Cardiovascular: Normal rate, regular rhythm and normal heart sounds.  Exam reveals no gallop and no friction rub.   No murmur heard. Pulmonary/Chest: Effort normal and breath sounds normal. No respiratory distress. He has no wheezes. He has no rales. He exhibits no tenderness.  Abdominal: Soft. He exhibits no distension and no mass. There is tenderness. There is no rebound  and no guarding.  ttp in the epigastrium and RUQ   Musculoskeletal: Normal range of motion. He exhibits no edema or tenderness.  Neurological: He is alert and oriented to person, place, and time.  Skin: Skin is warm and dry.  Psychiatric: He has a normal mood and affect. His behavior is normal. Judgment and thought content normal.  Nursing note and vitals reviewed.    ED Treatments / Results  Labs (all labs ordered are listed, but only abnormal results are displayed) Labs Reviewed  CBC WITH DIFFERENTIAL/PLATELET  LIPASE, BLOOD  COMPREHENSIVE METABOLIC PANEL    EKG  EKG Interpretation None        Radiology Ct Abdomen Pelvis W Contrast  Result Date: 09/05/2016 CLINICAL DATA:  Abdominal pain and vomiting for 2 days EXAM: CT ABDOMEN AND PELVIS WITH CONTRAST TECHNIQUE: Multidetector CT imaging of the abdomen and pelvis was performed using the standard protocol following bolus administration of intravenous contrast. CONTRAST:  1 ISOVUE-300 IOPAMIDOL (ISOVUE-300) INJECTION 61% COMPARISON:  11/01/2015 FINDINGS: LOWER CHEST: Mild cardiomegaly. Dependent bibasilar atelectasis with faint ground-glass appearance of both lower lobes, nonspecific but may be seen with alveolitis/pneumonitis or hypoventilatory change. HEPATOBILIARY: Liver and gallbladder are normal. PANCREAS: Normal. SPLEEN: Normal. ADRENALS/URINARY TRACT: Kidneys are orthotopic. No nephrolithiasis, hydronephrosis or solid renal masses. The unopacified ureters are normal in course and caliber. Urinary bladder is partially distended and unremarkable. Normal adrenal glands. STOMACH/BOWEL: The stomach, small and large bowel are normal in course and caliber without inflammatory changes. Diffuse colonic spasm is noted. Normal appendix. VASCULAR/LYMPHATIC: Aortoiliac vessels are normal in course and caliber. No lymphadenopathy by CT size criteria. REPRODUCTIVE: Normal. OTHER: No intraperitoneal free fluid or free air. MUSCULOSKELETAL: Nonacute. IMPRESSION: Diffuse colonic spasm. No bowel obstruction or acute inflammation. Bibasilar atelectasis ground-glass opacities, nonspecific, can be seen in an expiratory phase of respiration, partial filling of air spaces, inflammation or sequela of edema. Electronically Signed   By: Tollie Eth M.D.   On: 09/05/2016 21:28    Procedures Procedures (including critical care time)  Medications Ordered in ED Medications  sodium chloride 0.9 % bolus 1,000 mL (not administered)  HYDROmorphone (DILAUDID) injection 1 mg (not administered)  ondansetron (ZOFRAN) injection 4 mg (not administered)     Initial  Impression / Assessment and Plan / ED Course  I have reviewed the triage vital signs and the nursing notes.  Pertinent labs & imaging results that were available during my care of the patient were reviewed by me and considered in my medical decision making (see chart for details).  Clinical Course    Patient here with epigastric abdominal pain.  Pain is worsened with palpation.  Has had persistent vomiting and pain for 2 days.  CT from 2 days ago showed colonic spasm, but no acute surgical process.  Lipase was noted to be high.  I will check this again.  Will also check RUQ [place].  Consider pancreatitis.  Will treat pain and give fluids and reassess.  RUQ [place] is normal.  Labs are pending.   Will repeat lipase.  If elevated will plan for discharge pending PO challenge with pain meds and instructions to increase fluids.  If improved from 2 days ago, continue treatment prescribed 2 days ago.  Patient understands and agrees with the plan.  Patient signed out to [place], [place], who will continue care.  Final Clinical Impressions(s) / ED Diagnoses   Final diagnoses:  None    New Prescriptions New Prescriptions   No medications on file  Roxy HorsemanRobert Kenston Long[place], PA-C 09/07/16 1540    Mancel BaleElliott Wentz, MD 09/07/16 2117

## 2016-09-07 NOTE — Discharge Instructions (Signed)
Take your prescriptions as prescribed as having for pain relief. I recommend continuing to take your prescriptions that were given to you from the emergency department 2 days ago as prescribed as needed for additional pain relief and nausea/vomiting. I recommend continuing to drink a clear fluid diet for the next few days until your symptoms have improved. Refrain from drinking alcohol. I recommend calling the gastroenterology clinic listed above in the next week if her symptoms have not improved. Please return to the Emergency Department if symptoms worsen or new onset of fever, chest pain, difficulty breathing, new/worsening abdominal pain, vomiting, blood in vomit, unable to keep fluids down.

## 2016-12-01 ENCOUNTER — Encounter (HOSPITAL_COMMUNITY): Payer: Self-pay

## 2016-12-01 ENCOUNTER — Emergency Department (HOSPITAL_COMMUNITY)
Admission: EM | Admit: 2016-12-01 | Discharge: 2016-12-02 | Disposition: A | Discharge status: Home / Self Care | Payer: Self-pay | Attending: Emergency Medicine | Admitting: Emergency Medicine

## 2016-12-01 DIAGNOSIS — R197 Diarrhea, unspecified: Secondary | ICD-10-CM | POA: Insufficient documentation

## 2016-12-01 DIAGNOSIS — R1084 Generalized abdominal pain: Secondary | ICD-10-CM | POA: Insufficient documentation

## 2016-12-01 DIAGNOSIS — R1110 Vomiting, unspecified: Secondary | ICD-10-CM | POA: Insufficient documentation

## 2016-12-01 DIAGNOSIS — Z5321 Procedure and treatment not carried out due to patient leaving prior to being seen by health care provider: Secondary | ICD-10-CM | POA: Insufficient documentation

## 2016-12-01 MED ORDER — ONDANSETRON 4 MG PO TBDP
4.0000 mg | ORAL_TABLET | Freq: Once | ORAL | Status: AC | PRN
  Administered 2016-12-01: 4 mg via ORAL
  Filled 2016-12-01: qty 1

## 2016-12-01 NOTE — ED Notes (Signed)
No answer for blood draw @ this time. 

## 2016-12-01 NOTE — ED Notes (Signed)
Second call for blood draw with no answer.. 

## 2016-12-01 NOTE — ED Triage Notes (Addendum)
Per EMS, Pt, from home, c/o upper abdominal pain and n/v/d x "a couple days."  Pain score 10/10.  Pt reports taking phenergan w/o relief.

## 2016-12-01 NOTE — ED Notes (Signed)
Third call for blood draw with no answer. Triage RN made aware.

## 2016-12-02 NOTE — ED Notes (Signed)
Called to take to treatment room  No response  

## 2017-02-16 ENCOUNTER — Encounter (HOSPITAL_COMMUNITY): Payer: Self-pay | Admitting: Emergency Medicine

## 2017-02-16 ENCOUNTER — Emergency Department (HOSPITAL_COMMUNITY)
Admission: EM | Admit: 2017-02-16 | Discharge: 2017-02-16 | Disposition: A | Discharge status: Home / Self Care | Payer: Self-pay | Attending: Emergency Medicine | Admitting: Emergency Medicine

## 2017-02-16 DIAGNOSIS — K2900 Acute gastritis without bleeding: Secondary | ICD-10-CM | POA: Insufficient documentation

## 2017-02-16 DIAGNOSIS — R1013 Epigastric pain: Secondary | ICD-10-CM

## 2017-02-16 DIAGNOSIS — Z7141 Alcohol abuse counseling and surveillance of alcoholic: Secondary | ICD-10-CM | POA: Insufficient documentation

## 2017-02-16 DIAGNOSIS — J45909 Unspecified asthma, uncomplicated: Secondary | ICD-10-CM | POA: Insufficient documentation

## 2017-02-16 DIAGNOSIS — F17210 Nicotine dependence, cigarettes, uncomplicated: Secondary | ICD-10-CM | POA: Insufficient documentation

## 2017-02-16 LAB — DIFFERENTIAL
BASOS ABS: 0 10*3/uL (ref 0.0–0.1)
Basophils Relative: 0 %
EOS ABS: 0 10*3/uL (ref 0.0–0.7)
EOS PCT: 0 %
Lymphocytes Relative: 19 %
Lymphs Abs: 2 10*3/uL (ref 0.7–4.0)
MONO ABS: 0.7 10*3/uL (ref 0.1–1.0)
MONOS PCT: 7 %
Neutro Abs: 7.5 10*3/uL (ref 1.7–7.7)
Neutrophils Relative %: 74 %

## 2017-02-16 LAB — COMPREHENSIVE METABOLIC PANEL
ALT: 14 U/L — ABNORMAL LOW (ref 17–63)
AST: 21 U/L (ref 15–41)
Albumin: 5.2 g/dL — ABNORMAL HIGH (ref 3.5–5.0)
Alkaline Phosphatase: 45 U/L (ref 38–126)
Anion gap: 12 (ref 5–15)
BUN: 16 mg/dL (ref 6–20)
CHLORIDE: 103 mmol/L (ref 101–111)
CO2: 25 mmol/L (ref 22–32)
CREATININE: 1.37 mg/dL — AB (ref 0.61–1.24)
Calcium: 10.1 mg/dL (ref 8.9–10.3)
Glucose, Bld: 120 mg/dL — ABNORMAL HIGH (ref 65–99)
POTASSIUM: 3.7 mmol/L (ref 3.5–5.1)
Sodium: 140 mmol/L (ref 135–145)
TOTAL PROTEIN: 8.7 g/dL — AB (ref 6.5–8.1)
Total Bilirubin: 1.3 mg/dL — ABNORMAL HIGH (ref 0.3–1.2)

## 2017-02-16 LAB — CBC
HCT: 46.3 % (ref 39.0–52.0)
Hemoglobin: 16 g/dL (ref 13.0–17.0)
MCH: 24.4 pg — ABNORMAL LOW (ref 26.0–34.0)
MCHC: 34.6 g/dL (ref 30.0–36.0)
MCV: 70.7 fL — AB (ref 78.0–100.0)
PLATELETS: 278 10*3/uL (ref 150–400)
RBC: 6.55 MIL/uL — AB (ref 4.22–5.81)
RDW: 17 % — ABNORMAL HIGH (ref 11.5–15.5)
WBC: 10.3 10*3/uL (ref 4.0–10.5)

## 2017-02-16 LAB — POC OCCULT BLOOD, ED: FECAL OCCULT BLD: NEGATIVE

## 2017-02-16 LAB — TYPE AND SCREEN
ABO/RH(D): B POS
ANTIBODY SCREEN: NEGATIVE

## 2017-02-16 LAB — LIPASE, BLOOD: Lipase: 30 U/L (ref 11–51)

## 2017-02-16 LAB — ABO/RH: ABO/RH(D): B POS

## 2017-02-16 MED ORDER — PROMETHAZINE HCL 25 MG/ML IJ SOLN
25.0000 mg | Freq: Once | INTRAMUSCULAR | Status: AC
  Administered 2017-02-16: 25 mg via INTRAVENOUS
  Filled 2017-02-16: qty 1

## 2017-02-16 MED ORDER — MORPHINE SULFATE (PF) 4 MG/ML IV SOLN
4.0000 mg | Freq: Once | INTRAVENOUS | Status: AC
  Administered 2017-02-16: 4 mg via INTRAVENOUS
  Filled 2017-02-16: qty 1

## 2017-02-16 MED ORDER — OMEPRAZOLE 20 MG PO CPDR: 20.0000 mg | DELAYED_RELEASE_CAPSULE | Freq: Every day | ORAL | 0 refills | Status: DC

## 2017-02-16 MED ORDER — SODIUM CHLORIDE 0.9 % IV BOLUS (SEPSIS)
1000.0000 mL | Freq: Once | INTRAVENOUS | Status: AC
  Administered 2017-02-16: 1000 mL via INTRAVENOUS

## 2017-02-16 MED ORDER — FAMOTIDINE IN NACL 20-0.9 MG/50ML-% IV SOLN
20.0000 mg | Freq: Once | INTRAVENOUS | Status: AC
  Administered 2017-02-16: 20 mg via INTRAVENOUS
  Filled 2017-02-16: qty 50

## 2017-02-16 MED ORDER — GI COCKTAIL ~~LOC~~
30.0000 mL | Freq: Once | ORAL | Status: AC
Start: 2017-02-16 — End: 2017-02-16
  Administered 2017-02-16: 30 mL via ORAL
  Filled 2017-02-16: qty 30

## 2017-02-16 MED ORDER — PROMETHAZINE HCL 25 MG PO TABS: 25.0000 mg | ORAL_TABLET | Freq: Four times a day (QID) | ORAL | 0 refills | Status: DC | PRN

## 2017-02-16 NOTE — ED Triage Notes (Signed)
Pt c/o hematemesis and epigastric pain x years, worked up for same multiple times without diagnosis. Worsened 2 days ago. Multiple small red blood flecks in emesis. No blood in stool.

## 2017-02-16 NOTE — Discharge Instructions (Signed)
This is likely gastritis from alcohol. Please avoid alcohol and food triggers for you abdominal pain and vomiting.  Take medications as prescribed. You can also take tylenol for pain. Avoid ibuprofen/aleve/advil or other anti-inflammatory medications  Return for worsening symptoms, including fever, escalating pain, intractable vomiting or any other symptoms concerning to you.

## 2017-02-16 NOTE — ED Provider Notes (Signed)
[facility]-EMERGENCY DEPT Provider Note   CSN: [id number] Arrival date & time: 02/16/17  1549     History   Chief Complaint Chief Complaint  Patient presents with  . Abdominal Pain  . Hematemesis    HPI Edward Barrera is a 36 y.o. male.  The history is provided by the patient.  Abdominal Pain   This is a recurrent problem. The current episode started more than 2 days ago. The problem occurs constantly. The problem has been gradually worsening. The pain is associated with eating and alcohol use. The pain is located in the epigastric region. The pain is severe. Associated symptoms include nausea and vomiting. Pertinent negatives include fever, diarrhea, hematochezia, melena, constipation, dysuria, frequency and hematuria. The symptoms are aggravated by eating, drinking alcohol and vomiting. Nothing relieves the symptoms. Past workup includes CT scan. His past medical history is significant for GERD.   36 year old male who presents with epigastric abdominal pain with nausea and vomiting that started 3 days ago. It occurred after he was drinking alcohol. States that he has had similar symptoms multiple times before over the past several years. Symptoms are worse after eating certain foods as well as strict with drinking alcohol. He is noted flecks of blood in his vomit with this instance but denies any melena or hematochezia. No fevers or chills. Denies any urinary complaints. Has taken anti-inflammatory medications occasionally, but denies frequent or daily usage recently. Has not taken any medications for his symptoms.   Past Medical History:  Diagnosis Date  . Asthma     There are no active problems to display for this patient.   Past Surgical History:  Procedure Laterality Date  . I&D EXTREMITY  04/29/2012   Procedure: IRRIGATION AND DEBRIDEMENT EXTREMITY;  Surgeon: Dominica Severin, MD;  Location: [facility];  Service: Orthopedics;  Laterality: Right;       Home Medications    Prior  to Admission medications   Medication Sig Start Date End Date Taking? Authorizing Provider  acetaminophen (TYLENOL) 500 MG tablet Take 1,000 mg by mouth every 6 (six) hours as needed for moderate pain, fever or headache.   Yes Historical Provider, MD  dicyclomine (BENTYL) 20 MG tablet Take 1 tablet (20 mg total) by mouth 2 (two) times daily. Patient not taking: Reported on 02/16/2017 09/05/16   Daryl F de Villier II, PA  HYDROcodone-acetaminophen (NORCO/VICODIN) 5-325 MG tablet Take 1-2 tablets by mouth every 6 (six) hours as needed. Patient not taking: Reported on 02/16/2017 09/07/16   Roxy Horseman, PA-C  omeprazole (PRILOSEC) 20 MG capsule Take 1 capsule (20 mg total) by mouth daily. 02/16/17   Lavera Guise, MD  ondansetron (ZOFRAN ODT) 4 MG disintegrating tablet Take 1 tablet (4 mg total) by mouth every 8 (eight) hours as needed for nausea or vomiting. Patient not taking: Reported on 02/16/2017 09/05/16   Daryl F de Villier II, PA  promethazine (PHENERGAN) 25 MG tablet Take 1 tablet (25 mg total) by mouth every 6 (six) hours as needed for nausea or vomiting. 02/16/17   Lavera Guise, MD    Family History No family history on file.  Social History Social History  Substance Use Topics  . Smoking status: Current Every Day Smoker    Packs/day: 0.50    Types: Cigarettes  . Smokeless tobacco: Never Used  . Alcohol use Yes     Comment: on weekends     Allergies   Patient has no known allergies.   Review of Systems Review  of Systems  Constitutional: Negative for fever.  HENT: Negative for congestion.   Respiratory: Negative for shortness of breath.   Cardiovascular: Negative for chest pain.  Gastrointestinal: Positive for abdominal pain, nausea and vomiting. Negative for constipation, diarrhea, hematochezia and melena.  Genitourinary: Negative for dysuria, frequency and hematuria.  Musculoskeletal: Negative for back pain.  Allergic/Immunologic: Negative for immunocompromised state.    Neurological: Negative for syncope.  Hematological: Does not bruise/bleed easily.  Psychiatric/Behavioral: Negative for confusion.  All other systems reviewed and are negative.    Physical Exam Updated Vital Signs BP 111/61 (BP Location: Right Arm)   Pulse 65   Temp 97.8 F (36.6 C) (Oral)   Resp 18   Ht 6\' 2"  (1.88 m)   Wt 250 lb (113.4 kg)   SpO2 100%   BMI 32.10 kg/m   Physical Exam Physical Exam  Nursing note and vitals reviewed. Constitutional: Non-toxic, and in no acute distress Head: Normocephalic and atraumatic.  Mouth/Throat: Oropharynx is clear and moist.  Neck: Normal range of motion. Neck supple.  Cardiovascular: Normal rate and regular rhythm.   Pulmonary/Chest: Effort normal and breath sounds normal.  Abdominal: Soft. There is epigastric tenderness. There is no rebound and no guarding. Negative murphy's sign. No tenderness at McBurney's point. Musculoskeletal: Normal range of motion.  Neurological: Alert, no facial droop, fluent speech, moves all extremities symmetrically Skin: Skin is warm and dry.  Psychiatric: Cooperative   ED Treatments / Results  Labs (all labs ordered are listed, but only abnormal results are displayed) Labs Reviewed  COMPREHENSIVE METABOLIC PANEL - Abnormal; Notable for the following:       Result Value   Glucose, Bld 120 (*)    Creatinine, Ser 1.37 (*)    Total Protein 8.7 (*)    Albumin 5.2 (*)    ALT 14 (*)    Total Bilirubin 1.3 (*)    All other components within normal limits  CBC - Abnormal; Notable for the following:    RBC 6.55 (*)    MCV 70.7 (*)    MCH 24.4 (*)    RDW 17.0 (*)    All other components within normal limits  LIPASE, BLOOD  DIFFERENTIAL  POC OCCULT BLOOD, ED  TYPE AND SCREEN  ABO/RH    EKG  EKG Interpretation None       Radiology No results found.  Procedures Procedures (including critical care time)  Medications Ordered in ED Medications  sodium chloride 0.9 % bolus 1,000 mL (0  mLs Intravenous Stopped 02/16/17 1939)  promethazine (PHENERGAN) injection 25 mg (25 mg Intravenous Given 02/16/17 1740)  famotidine (PEPCID) IVPB 20 mg premix (0 mg Intravenous Stopped 02/16/17 1810)  morphine 4 MG/ML injection 4 mg (4 mg Intravenous Given 02/16/17 1739)  gi cocktail (Maalox,Lidocaine,Donnatal) (30 mLs Oral Given 02/16/17 1939)     Initial Impression / Assessment and Plan / ED Course  I have reviewed the triage vital signs and the nursing notes.  Pertinent labs & imaging results that were available during my care of the patient were reviewed by me and considered in my medical decision making (see chart for details).     Presenting with epigastric abdominal pain, nausea vomiting in the setting of alcohol intake. History and exam suggestive of acute gastritis. Has noted some red flecks of blood in his vomit, but not suspecting severe GI bleeding. Guaiac negative from below. He is hemodynamically stable with stable hemoglobin. BUN/creatinine ratio is normal and not suggestive of severe GI bleed. Abdomen  overall soft and nonsurgical with focal epigastric discomfort. This time not concerned about other serious intra-abdominal processes such as cholecystitis, appendicitis, obstruction, or other processes. Normal liver function testing and lipase. Given symptomatic treatment for gastritis, and he feels improved. Drinking ginger ale. We'll continue supportive measures for home. Discussed alcohol cessation and resources offered. The patient appears reasonably screened and/or stabilized for discharge and I doubt any other medical condition or other [facility] requiring further screening, evaluation, or treatment in the ED at this time prior to discharge. Strict return and follow-up instructions reviewed. He expressed understanding of all discharge instructions and felt comfortable with the plan of care.  Final Clinical Impressions(s) / ED Diagnoses   Final diagnoses:  Epigastric pain  Acute  gastritis, presence of bleeding unspecified, unspecified gastritis type    New Prescriptions New Prescriptions   OMEPRAZOLE (PRILOSEC) 20 MG CAPSULE    Take 1 capsule (20 mg total) by mouth daily.   PROMETHAZINE (PHENERGAN) 25 MG TABLET    Take 1 tablet (25 mg total) by mouth every 6 (six) hours as needed for nausea or vomiting.     Lavera Guiseana Duo Esmeralda Blanford, MD 02/16/17 2011

## 2017-03-26 ENCOUNTER — Emergency Department (HOSPITAL_COMMUNITY)
Admission: EM | Admit: 2017-03-26 | Discharge: 2017-03-26 | Disposition: A | Discharge status: Home / Self Care | Payer: Self-pay | Attending: Emergency Medicine | Admitting: Emergency Medicine

## 2017-03-26 ENCOUNTER — Emergency Department (HOSPITAL_COMMUNITY): Payer: Self-pay

## 2017-03-26 ENCOUNTER — Encounter (HOSPITAL_COMMUNITY): Payer: Self-pay | Admitting: Emergency Medicine

## 2017-03-26 DIAGNOSIS — S0101XA Laceration without foreign body of scalp, initial encounter: Secondary | ICD-10-CM | POA: Insufficient documentation

## 2017-03-26 DIAGNOSIS — W25XXXA Contact with sharp glass, initial encounter: Secondary | ICD-10-CM | POA: Insufficient documentation

## 2017-03-26 DIAGNOSIS — Y9389 Activity, other specified: Secondary | ICD-10-CM | POA: Insufficient documentation

## 2017-03-26 DIAGNOSIS — Z79899 Other long term (current) drug therapy: Secondary | ICD-10-CM | POA: Insufficient documentation

## 2017-03-26 DIAGNOSIS — Z23 Encounter for immunization: Secondary | ICD-10-CM | POA: Insufficient documentation

## 2017-03-26 DIAGNOSIS — Y999 Unspecified external cause status: Secondary | ICD-10-CM | POA: Insufficient documentation

## 2017-03-26 DIAGNOSIS — Y929 Unspecified place or not applicable: Secondary | ICD-10-CM | POA: Insufficient documentation

## 2017-03-26 DIAGNOSIS — F17210 Nicotine dependence, cigarettes, uncomplicated: Secondary | ICD-10-CM | POA: Insufficient documentation

## 2017-03-26 DIAGNOSIS — J45909 Unspecified asthma, uncomplicated: Secondary | ICD-10-CM | POA: Insufficient documentation

## 2017-03-26 MED ORDER — TETANUS-DIPHTH-ACELL PERTUSSIS 5-2.5-18.5 LF-MCG/0.5 IM SUSP
0.5000 mL | Freq: Once | INTRAMUSCULAR | Status: AC
  Administered 2017-03-26: 0.5 mL via INTRAMUSCULAR
  Filled 2017-03-26: qty 0.5
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
## 2018-04-30 DIAGNOSIS — R1011 Right upper quadrant pain: Secondary | ICD-10-CM | POA: Insufficient documentation

## 2018-04-30 DIAGNOSIS — F17210 Nicotine dependence, cigarettes, uncomplicated: Secondary | ICD-10-CM | POA: Insufficient documentation

## 2018-04-30 DIAGNOSIS — J45909 Unspecified asthma, uncomplicated: Secondary | ICD-10-CM | POA: Insufficient documentation

## 2018-04-30 DIAGNOSIS — R1013 Epigastric pain: Secondary | ICD-10-CM | POA: Insufficient documentation

## 2018-05-01 ENCOUNTER — Other Ambulatory Visit: Payer: Self-pay

## 2018-05-01 ENCOUNTER — Emergency Department (HOSPITAL_COMMUNITY): Payer: Self-pay

## 2018-05-01 ENCOUNTER — Encounter (HOSPITAL_COMMUNITY): Payer: Self-pay

## 2018-05-01 ENCOUNTER — Emergency Department (HOSPITAL_COMMUNITY)
Admission: EM | Admit: 2018-05-01 | Discharge: 2018-05-01 | Disposition: A | Discharge status: Home / Self Care | Payer: Self-pay | Attending: Emergency Medicine | Admitting: Emergency Medicine

## 2018-05-01 DIAGNOSIS — R1011 Right upper quadrant pain: Secondary | ICD-10-CM

## 2018-05-01 DIAGNOSIS — R1013 Epigastric pain: Secondary | ICD-10-CM

## 2018-05-01 LAB — CBC WITH DIFFERENTIAL/PLATELET
Basophils Absolute: 0 10*3/uL (ref 0.0–0.1)
Basophils Relative: 0 %
Eosinophils Absolute: 0 10*3/uL (ref 0.0–0.7)
Eosinophils Relative: 0 %
HEMATOCRIT: 45.3 % (ref 39.0–52.0)
Hemoglobin: 15 g/dL (ref 13.0–17.0)
LYMPHS PCT: 25 %
Lymphs Abs: 2.2 10*3/uL (ref 0.7–4.0)
MCH: 23.8 pg — AB (ref 26.0–34.0)
MCHC: 33.1 g/dL (ref 30.0–36.0)
MCV: 71.9 fL — AB (ref 78.0–100.0)
MONO ABS: 0.7 10*3/uL (ref 0.1–1.0)
MONOS PCT: 8 %
NEUTROS ABS: 6 10*3/uL (ref 1.7–7.7)
Neutrophils Relative %: 67 %
Platelets: 232 10*3/uL (ref 150–400)
RBC: 6.3 MIL/uL — ABNORMAL HIGH (ref 4.22–5.81)
RDW: 16.8 % — AB (ref 11.5–15.5)
WBC: 8.9 10*3/uL (ref 4.0–10.5)

## 2018-05-01 LAB — I-STAT CHEM 8, ED
BUN: 14 mg/dL (ref 6–20)
CREATININE: 1.3 mg/dL — AB (ref 0.61–1.24)
Calcium, Ion: 1.15 mmol/L (ref 1.15–1.40)
Chloride: 104 mmol/L (ref 101–111)
GLUCOSE: 97 mg/dL (ref 65–99)
HCT: 51 % (ref 39.0–52.0)
Hemoglobin: 17.3 g/dL — ABNORMAL HIGH (ref 13.0–17.0)
Potassium: 3.9 mmol/L (ref 3.5–5.1)
Sodium: 141 mmol/L (ref 135–145)
TCO2: 24 mmol/L (ref 22–32)

## 2018-05-01 LAB — LIPASE, BLOOD: Lipase: 65 U/L — ABNORMAL HIGH (ref 11–51)

## 2018-05-01 MED ORDER — SODIUM CHLORIDE 0.9 % IV BOLUS
1000.0000 mL | Freq: Once | INTRAVENOUS | Status: AC
  Administered 2018-05-01: 1000 mL via INTRAVENOUS

## 2018-05-01 MED ORDER — ONDANSETRON 4 MG PO TBDP: 4.0000 mg | ORAL_TABLET | Freq: Three times a day (TID) | ORAL | 0 refills | Status: AC | PRN

## 2018-05-01 MED ORDER — MORPHINE SULFATE (PF) 4 MG/ML IV SOLN
4.0000 mg | Freq: Once | INTRAVENOUS | Status: AC
  Administered 2018-05-01: 4 mg via INTRAVENOUS
  Filled 2018-05-01: qty 1

## 2018-05-01 MED ORDER — OMEPRAZOLE 20 MG PO CPDR: 20.0000 mg | DELAYED_RELEASE_CAPSULE | Freq: Every day | ORAL | 0 refills | Status: DC

## 2018-05-01 MED ORDER — GI COCKTAIL ~~LOC~~
30.0000 mL | Freq: Once | ORAL | Status: AC
  Administered 2018-05-01: 30 mL via ORAL
  Filled 2018-05-01: qty 30

## 2018-05-01 MED ORDER — ONDANSETRON HCL 4 MG/2ML IJ SOLN
4.0000 mg | Freq: Once | INTRAMUSCULAR | Status: AC
  Administered 2018-05-01: 4 mg via INTRAVENOUS
  Filled 2018-05-01: qty 2

## 2018-05-01 NOTE — ED Triage Notes (Signed)
Pt complains of severe abdominal pain and vomiting for three days

## 2018-05-01 NOTE — ED Notes (Signed)
Discharge instructions reviewed with pt. Pt verbalized understanding. PIV removed. Pt ambulatory to waiting room.

## 2018-05-01 NOTE — ED Notes (Signed)
Blood work drawn. PIV attempted x1.

## 2018-05-01 NOTE — Discharge Instructions (Addendum)
You were seen today for abdominal pain and vomiting.  Your work-up is largely reassuring.  Your ultrasound of your gallbladder is negative.  You may have some reflux or peptic ulcer.  Avoid alcohol.  Take medications as prescribed.  Make sure to stay hydrated.  Follow-up with gastroenterology if not improving.

## 2018-05-01 NOTE — ED Provider Notes (Signed)
[facility]-EMERGENCY DEPT Provider Note   CSN: [id number] Arrival date & time: 04/30/18  2228     History   Chief Complaint Chief Complaint  Patient presents with  . Emesis    HPI DEANNA BOEHLKE is a 37 y.o. male.  HPI  This is a 37 year old male with a history of asthma who presents with abdominal pain and vomiting.  Patient reports 2 to 3-day history of worsening epigastric and right upper quadrant abdominal pain.  He is unsure whether this is related to food intake as he has not been eating.  He states the pain is sharp and nonradiating.  Currently his pain is 10 out of 10.  He has not taken anything for the pain.  He attempted to take Tylenol but vomited.  He reports nonbilious, nonbloody emesis.  He reports normal bowel movements.  He denies any alcohol or significant NSAID use.  He denies fevers, chest pain, shortness of breath.  Past Medical History:  Diagnosis Date  . Asthma     There are no active problems to display for this patient.   Past Surgical History:  Procedure Laterality Date  . I&D EXTREMITY  04/29/2012   Procedure: IRRIGATION AND DEBRIDEMENT EXTREMITY;  Surgeon: Dominica Severin, MD;  Location: [facility];  Service: Orthopedics;  Laterality: Right;        Home Medications    Prior to Admission medications   Medication Sig Start Date End Date Taking? Authorizing Provider  acetaminophen (TYLENOL) 500 MG tablet Take 1,000 mg by mouth every 6 (six) hours as needed for moderate pain, fever or headache.   Yes [provider]  dicyclomine (BENTYL) 20 MG tablet Take 1 tablet (20 mg total) by mouth 2 (two) times daily. Patient not taking: Reported on 02/16/2017 09/05/16   de Villier, Daryl F II, PA  HYDROcodone-acetaminophen (NORCO/VICODIN) 5-325 MG tablet Take 1-2 tablets by mouth every 6 (six) hours as needed. Patient not taking: Reported on 02/16/2017 09/07/16   Roxy Horseman, PA-C  omeprazole (PRILOSEC) 20 MG capsule Take 1  capsule (20 mg total) by mouth daily. 05/01/18   Adekunle Rohrbach, Mayer Masker, MD  ondansetron (ZOFRAN ODT) 4 MG disintegrating tablet Take 1 tablet (4 mg total) by mouth every 8 (eight) hours as needed for nausea or vomiting. Patient not taking: Reported on 02/16/2017 09/05/16   de Villier, Daryl F II, PA  ondansetron (ZOFRAN ODT) 4 MG disintegrating tablet Take 1 tablet (4 mg total) by mouth every 8 (eight) hours as needed for nausea or vomiting. 05/01/18   Dreama Kuna, Mayer Masker, MD  promethazine (PHENERGAN) 25 MG tablet Take 1 tablet (25 mg total) by mouth every 6 (six) hours as needed for nausea or vomiting. Patient not taking: Reported on 05/01/2018 02/16/17   Lavera Guise, MD    Family History History reviewed. No pertinent family history.  Social History Social History   Tobacco Use  . Smoking status: Current Every Day Smoker    Packs/day: 0.50    Types: Cigarettes  . Smokeless tobacco: Never Used  Substance Use Topics  . Alcohol use: Yes    Comment: on weekends  . Drug use: No     Allergies   Patient has no known allergies.   Review of Systems Review of Systems  Constitutional: Negative for fever.  Respiratory: Negative for shortness of breath.   Cardiovascular: Negative for chest pain.  Gastrointestinal: Positive for abdominal pain, nausea and vomiting. Negative for constipation and diarrhea.  Genitourinary: Negative for  dysuria.  Musculoskeletal: Negative for back pain.  All other systems reviewed and are negative.    Physical Exam Updated Vital Signs BP (!) 144/84   Pulse 61   Temp 99.1 F (37.3 C) (Oral)   Resp 15   Ht $RemoveBefo reDEID_NIaeIfbAXgLTMzpQwEqMGDOpSgIEKRNi$6\' 2"34.67 kg/m   Physical Exam  Constitutional: He is oriented to person, place, and time. He appears well-developed and well-nourished. No distress.  HENT:  Head: Normocephalic and atraumatic.  Cardiovascular: Normal rate, regular rhythm and normal heart sounds.  No murmur  heard. Pulmonary/Chest: Effort normal and breath sounds normal. No respiratory distress. He has no wheezes.  Abdominal: Soft. Bowel sounds are normal. There is tenderness. There is no rebound and no guarding.  Epigastric and right upper quadrant tenderness to palpation, no rebound or guarding  Musculoskeletal: He exhibits no edema.  Neurological: He is alert and oriented to person, place, and time.  Skin: Skin is warm and dry.  Psychiatric: He has a normal mood and affect.  Nursing note and vitals reviewed.    ED Treatments / Results  Labs (all labs ordered are listed, but only abnormal results are displayed) Labs Reviewed  CBC WITH DIFFERENTIAL/PLATELET - Abnormal; Notable for the following components:      Result Value   RBC 6.30 (*)    MCV 71.9 (*)    MCH 23.8 (*)    RDW 16.8 (*)    All other components within normal limits  LIPASE, BLOOD - Abnormal; Notable for the following components:   Lipase 65 (*)    All other components within normal limits  I-STAT CHEM 8, ED - Abnormal; Notable for the following components:   Creatinine, Ser 1.30 (*)    Hemoglobin 17.3 (*)    All other components within normal limits    EKG None  Radiology [place] Abdomen Limited Ruq  Result Date: 05/01/2018 CLINICAL DATA:  Initial evaluation for acute right upper quadrant pain. EXAM: ULTRASOUND ABDOMEN LIMITED RIGHT UPPER QUADRANT COMPARISON:  Prior ultrasound from 09/07/2016. FINDINGS: Gallbladder: No gallstones or wall thickening visualized. No sonographic Murphy sign noted by sonographer. Common bile duct: Diameter: 3 mm Liver: No focal lesion identified. Within normal limits in parenchymal echogenicity. Portal vein is patent on color Doppler imaging with normal direction of blood flow towards the liver. IMPRESSION: Negative right upper quadrant ultrasound. No evidence for cholelithiasis, acute cholecystitis, or biliary dilatation. Electronically Signed   By: Rise MuBenjamin  McClintock M.D.   On: 05/01/2018  03:14    Procedures Procedures (including critical care time)  Medications Ordered in ED Medications  morphine 4 MG/ML injection 4 mg (4 mg Intravenous Given 05/01/18 0159)  ondansetron (ZOFRAN) injection 4 mg (4 mg Intravenous Given 05/01/18 0159)  sodium chloride 0.9 % bolus 1,000 mL (0 mLs Intravenous Stopped 05/01/18 0234)  gi cocktail (Maalox,Lidocaine,Donnatal) (30 mLs Oral Given 05/01/18 0407)     Initial Impression / Assessment and Plan / ED Course  I have reviewed the triage vital signs and the nursing notes.  Pertinent labs & imaging results that were available during my care of the patient were reviewed by me and considered in my medical decision making (see chart for details).     Patient presents with abdominal pain, nausea, vomiting.  He is overall nontoxic-appearing and vital signs are reassuring.  He appears hydrated.  Basic lab work obtained including liver function testing and lipase.  Considerations include gallbladder pathology, gastritis,  peptic ulcer, pancreatitis, less likely appendicitis given location and nature of pain.  Lab work reviewed.  This is largely reassuring.  Lipase is minimally elevated at 65 but this is less than 2 time is the upper limit of normal.  Given that he is low risk otherwise, doubt this is the etiology of his pain.  Right upper quadrant ultrasound does not show any evidence of cholelithiasis or cholecystitis.  Patient much improved with medications including Zofran, fluids, and morphine.  He also reports significant improvement with a GI cocktail.  This is suggestive of gastritis versus ulcer.  Will discharge with Zofran and omeprazole.  Follow-up with GI.  Patient is tolerating fluids.  After history, exam, and medical workup I feel the patient has been appropriately medically screened and is safe for discharge home. Pertinent diagnoses were discussed with the patient. Patient was given return precautions.   Final Clinical Impressions(s) / ED  Diagnoses   Final diagnoses:  RUQ pain  Epigastric pain    ED Discharge Orders        Ordered    omeprazole (PRILOSEC) 20 MG capsule  Daily     05/01/18 0426    ondansetron (ZOFRAN ODT) 4 MG disintegrating tablet  Every 8 hours PRN     05/01/18 0426       Khyler Eschmann, Mayer Masker, MD 05/01/18 0430

## 2019-11-15 ENCOUNTER — Emergency Department (HOSPITAL_COMMUNITY): Payer: No Typology Code available for payment source

## 2019-11-15 ENCOUNTER — Encounter (HOSPITAL_COMMUNITY): Payer: Self-pay

## 2019-11-15 ENCOUNTER — Emergency Department (HOSPITAL_COMMUNITY)
Admission: EM | Admit: 2019-11-15 | Discharge: 2019-11-15 | Disposition: A | Discharge status: Home / Self Care | Payer: No Typology Code available for payment source | Attending: Emergency Medicine | Admitting: Emergency Medicine

## 2019-11-15 ENCOUNTER — Other Ambulatory Visit: Payer: Self-pay

## 2019-11-15 DIAGNOSIS — Y93I9 Activity, other involving external motion: Secondary | ICD-10-CM | POA: Diagnosis not present

## 2019-11-15 DIAGNOSIS — M542 Cervicalgia: Secondary | ICD-10-CM

## 2019-11-15 DIAGNOSIS — S161XXA Strain of muscle, fascia and tendon at neck level, initial encounter: Secondary | ICD-10-CM | POA: Diagnosis not present

## 2019-11-15 DIAGNOSIS — S4992XA Unspecified injury of left shoulder and upper arm, initial encounter: Secondary | ICD-10-CM | POA: Diagnosis present

## 2019-11-15 DIAGNOSIS — Y92410 Unspecified street and highway as the place of occurrence of the external cause: Secondary | ICD-10-CM | POA: Diagnosis not present

## 2019-11-15 DIAGNOSIS — F17210 Nicotine dependence, cigarettes, uncomplicated: Secondary | ICD-10-CM | POA: Diagnosis not present

## 2019-11-15 DIAGNOSIS — J45909 Unspecified asthma, uncomplicated: Secondary | ICD-10-CM | POA: Diagnosis not present

## 2019-11-15 DIAGNOSIS — Y999 Unspecified external cause status: Secondary | ICD-10-CM | POA: Insufficient documentation

## 2019-11-15 DIAGNOSIS — S46812A Strain of other muscles, fascia and tendons at shoulder and upper arm level, left arm, initial encounter: Secondary | ICD-10-CM | POA: Diagnosis not present

## 2019-11-15 DIAGNOSIS — S46912A Strain of unspecified muscle, fascia and tendon at shoulder and upper arm level, left arm, initial encounter: Secondary | ICD-10-CM

## 2019-11-15 MED ORDER — HYDROCODONE-ACETAMINOPHEN 5-325 MG PO TABS
2.0000 | ORAL_TABLET | Freq: Once | ORAL | Status: AC
  Administered 2019-11-15: 2 via ORAL
  Filled 2019-11-15: qty 2
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
## 2019-11-15 NOTE — ED Notes (Signed)
Wylder PA at bedside 

## 2019-11-15 NOTE — Discharge Instructions (Addendum)
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
## 2019-11-15 NOTE — ED Notes (Signed)
Pt verbalizes understanding of DC instructions. Pt belongings returned and is ambulatory out of ED.  

## 2020-03-25 ENCOUNTER — Emergency Department (HOSPITAL_COMMUNITY): Payer: Self-pay

## 2020-03-25 ENCOUNTER — Encounter (HOSPITAL_COMMUNITY): Payer: Self-pay | Admitting: *Deleted

## 2020-03-25 ENCOUNTER — Emergency Department (HOSPITAL_COMMUNITY)
Admission: EM | Admit: 2020-03-25 | Discharge: 2020-03-25 | Disposition: A | Discharge status: Home / Self Care | Payer: Self-pay | Attending: Emergency Medicine | Admitting: Emergency Medicine

## 2020-03-25 ENCOUNTER — Other Ambulatory Visit: Payer: Self-pay

## 2020-03-25 DIAGNOSIS — J45909 Unspecified asthma, uncomplicated: Secondary | ICD-10-CM | POA: Insufficient documentation

## 2020-03-25 DIAGNOSIS — R1010 Upper abdominal pain, unspecified: Secondary | ICD-10-CM | POA: Insufficient documentation

## 2020-03-25 DIAGNOSIS — F17210 Nicotine dependence, cigarettes, uncomplicated: Secondary | ICD-10-CM | POA: Insufficient documentation

## 2020-03-25 LAB — COMPREHENSIVE METABOLIC PANEL
ALT: 16 U/L (ref 0–44)
AST: 18 U/L (ref 15–41)
Albumin: 5 g/dL (ref 3.5–5.0)
Alkaline Phosphatase: 44 U/L (ref 38–126)
Anion gap: 14 (ref 5–15)
BUN: 15 mg/dL (ref 6–20)
CO2: 25 mmol/L (ref 22–32)
Calcium: 9.5 mg/dL (ref 8.9–10.3)
Chloride: 99 mmol/L (ref 98–111)
Creatinine, Ser: 1.27 mg/dL — ABNORMAL HIGH (ref 0.61–1.24)
GFR calc Af Amer: >60 mL/min (ref >60)
GFR calc non Af Amer: >60 mL/min (ref >60)
Glucose, Bld: 117 mg/dL — ABNORMAL HIGH (ref 70–99)
Potassium: 3.5 mmol/L (ref 3.5–5.1)
Sodium: 138 mmol/L (ref 135–145)
Total Bilirubin: 1 mg/dL (ref 0.3–1.2)
Total Protein: 8.2 g/dL — ABNORMAL HIGH (ref 6.5–8.1)

## 2020-03-25 LAB — LIPASE, BLOOD: Lipase: 39 U/L (ref 11–51)

## 2020-03-25 LAB — CBC
HCT: 47.2 % (ref 39.0–52.0)
Hemoglobin: 15.3 g/dL (ref 13.0–17.0)
MCH: 23.7 pg — ABNORMAL LOW (ref 26.0–34.0)
MCHC: 32.4 g/dL (ref 30.0–36.0)
MCV: 73.1 fL — ABNORMAL LOW (ref 80.0–100.0)
Platelets: 246 10*3/uL (ref 150–400)
RBC: 6.46 MIL/uL — ABNORMAL HIGH (ref 4.22–5.81)
RDW: 18.3 % — ABNORMAL HIGH (ref 11.5–15.5)
WBC: 10.5 10*3/uL (ref 4.0–10.5)
nRBC: 0 % (ref 0.0–0.2)

## 2020-03-25 MED ORDER — HYDROMORPHONE HCL 1 MG/ML IJ SOLN
1.0000 mg | Freq: Once | INTRAMUSCULAR | Status: AC
  Administered 2020-03-25: 10:00:00 1 mg via INTRAVENOUS
  Filled 2020-03-25: qty 1

## 2020-03-25 MED ORDER — SODIUM CHLORIDE 0.9% FLUSH
3.0000 mL | Freq: Once | INTRAVENOUS | Status: AC
  Administered 2020-03-25: 3 mL via INTRAVENOUS

## 2020-03-25 MED ORDER — PANTOPRAZOLE SODIUM 20 MG PO TBEC: 20.0000 mg | DELAYED_RELEASE_TABLET | Freq: Every day | ORAL | 1 refills | Status: AC

## 2020-03-25 MED ORDER — SODIUM CHLORIDE 0.9 % IV BOLUS (SEPSIS)
2000.0000 mL | Freq: Once | INTRAVENOUS | Status: AC
  Administered 2020-03-25: 08:00:00 2000 mL via INTRAVENOUS

## 2020-03-25 MED ORDER — HYDROCODONE-ACETAMINOPHEN 5-325 MG PO TABS: 1.0000 | ORAL_TABLET | Freq: Four times a day (QID) | ORAL | 0 refills | Status: AC | PRN

## 2020-03-25 MED ORDER — ONDANSETRON HCL 4 MG/2ML IJ SOLN
4.0000 mg | Freq: Once | INTRAMUSCULAR | Status: AC
  Administered 2020-03-25: 08:00:00 4 mg via INTRAVENOUS
  Filled 2020-03-25: qty 2

## 2020-03-25 MED ORDER — PANTOPRAZOLE SODIUM 40 MG IV SOLR
40.0000 mg | Freq: Once | INTRAVENOUS | Status: AC
  Administered 2020-03-25: 08:00:00 40 mg via INTRAVENOUS
  Filled 2020-03-25: qty 40

## 2020-03-25 MED ORDER — HYDROMORPHONE HCL 1 MG/ML IJ SOLN
0.5000 mg | Freq: Once | INTRAMUSCULAR | Status: AC
  Administered 2020-03-25: 08:00:00 0.5 mg via INTRAVENOUS
  Filled 2020-03-25: qty 1

## 2020-03-25 MED ORDER — ONDANSETRON HCL 4 MG PO TABS: 4.0000 mg | ORAL_TABLET | Freq: Four times a day (QID) | ORAL | 0 refills | Status: AC

## 2020-03-25 NOTE — ED Triage Notes (Signed)
Vomiting for 3 days, generalized abdominal pain.

## 2020-03-25 NOTE — ED Notes (Signed)
Pt stated can't give urine at this time due to not being able to keep anything down.

## 2020-03-25 NOTE — ED Notes (Signed)
D/C'd to home with written and verbal instructions. Voiced understanding. A/Ox3. Skin w/d/pink. Resp WNL, equal and non-labored. IV d/c'd and intact. Bleeding controlled. Declined w/c. Gait steady. Ambulated out of ED independently and safely with belongings. NAD. No complaints voiced.

## 2020-03-25 NOTE — ED Provider Notes (Signed)
[facility] DEPT Provider Note   CSN: [id number] Arrival date & time: 03/25/20  7035     History Chief Complaint  Patient presents with  . Emesis    Edward Barrera is a 39 y.o. male.  Patient complains of vomiting and epigastric pain.  Patient is not vomiting any blood.  The history is provided by the patient. No language interpreter was used.  Emesis Severity:  Mild Timing:  Constant Quality:  Stomach contents Able to tolerate:  Liquids Progression:  Worsening Chronicity:  New Recent urination:  Normal Context: not post-tussive   Relieved by:  Nothing Associated symptoms: abdominal pain   Associated symptoms: no cough, no diarrhea and no headaches        Past Medical History:  Diagnosis Date  . Asthma     There are no problems to display for this patient.   Past Surgical History:  Procedure Laterality Date  . I & D EXTREMITY  04/29/2012   Procedure: IRRIGATION AND DEBRIDEMENT EXTREMITY;  Surgeon: Roseanne Kaufman, MD;  Location: [facility];  Service: Orthopedics;  Laterality: Right;       No family history on file.  Social History   Tobacco Use  . Smoking status: Current Every Day Smoker    Packs/day: 0.50    Types: Cigarettes  . Smokeless tobacco: Never Used  Substance Use Topics  . Alcohol use: Yes    Comment: on weekends  . Drug use: No    Home Medications Prior to Admission medications   Medication Sig Start Date End Date Taking? Authorizing Provider  dicyclomine (BENTYL) 20 MG tablet Take 1 tablet (20 mg total) by mouth 2 (two) times daily. Patient not taking: Reported on 02/16/2017 09/05/16   de Villier, Daryl F II, PA  HYDROcodone-acetaminophen (NORCO/VICODIN) 5-325 MG tablet Take 1 tablet by mouth every 6 (six) hours as needed for moderate pain. 03/25/20   Milton Ferguson, MD  omeprazole (PRILOSEC) 20 MG capsule Take 1 capsule (20 mg total) by mouth daily. Patient not taking: Reported on 03/25/2020 05/01/18   Horton,  Barbette Hair, MD  ondansetron (ZOFRAN ODT) 4 MG disintegrating tablet Take 1 tablet (4 mg total) by mouth every 8 (eight) hours as needed for nausea or vomiting. Patient not taking: Reported on 02/16/2017 09/05/16   de Villier, Daryl F II, PA  ondansetron (ZOFRAN ODT) 4 MG disintegrating tablet Take 1 tablet (4 mg total) by mouth every 8 (eight) hours as needed for nausea or vomiting. Patient not taking: Reported on 03/25/2020 05/01/18   Horton, Barbette Hair, MD  ondansetron (ZOFRAN) 4 MG tablet Take 1 tablet (4 mg total) by mouth every 6 (six) hours. 03/25/20   Milton Ferguson, MD  pantoprazole (PROTONIX) 20 MG tablet Take 1 tablet (20 mg total) by mouth daily. 03/25/20   Milton Ferguson, MD  promethazine (PHENERGAN) 25 MG tablet Take 1 tablet (25 mg total) by mouth every 6 (six) hours as needed for nausea or vomiting. Patient not taking: Reported on 05/01/2018 02/16/17   Forde Dandy, MD    Allergies    Patient has no known allergies.  Review of Systems   Review of Systems  Constitutional: Negative for appetite change and fatigue.  HENT: Negative for congestion, ear discharge and sinus pressure.   Eyes: Negative for discharge.  Respiratory: Negative for cough.   Cardiovascular: Negative for chest pain.  Gastrointestinal: Positive for abdominal pain and vomiting. Negative for diarrhea.  Genitourinary: Negative for frequency and hematuria.  Musculoskeletal: Negative  for back pain.  Skin: Negative for rash.  Neurological: Negative for seizures and headaches.  Psychiatric/Behavioral: Negative for hallucinations.    Physical Exam Updated Vital Signs BP (!) 143/103   Pulse (!) 59   Temp 98.4 F (36.9 C) (Oral)   Resp 16   Ht 6\' 2"  (1.88 m)   Wt 113.4 kg   SpO2 98%   BMI 32.10 kg/m   Physical Exam Vitals reviewed.  Constitutional:      Appearance: He is well-developed.  HENT:     Head: Normocephalic.     Nose: Nose normal.  Eyes:     General: No scleral icterus.    Conjunctiva/sclera:  Conjunctivae normal.  Neck:     Thyroid: No thyromegaly.  Cardiovascular:     Rate and Rhythm: Normal rate and regular rhythm.     Heart sounds: No murmur. No friction rub. No gallop.   Pulmonary:     Breath sounds: No stridor. No wheezing or rales.  Chest:     Chest wall: No tenderness.  Abdominal:     General: There is no distension.     Tenderness: There is abdominal tenderness. There is no rebound.  Musculoskeletal:        General: Normal range of motion.     Cervical back: Neck supple.  Lymphadenopathy:     Cervical: No cervical adenopathy.  Skin:    Findings: No erythema or rash.  Neurological:     Mental Status: He is alert and oriented to person, place, and time.     Motor: No abnormal muscle tone.     Coordination: Coordination normal.  Psychiatric:        Behavior: Behavior normal.     ED Results / Procedures / Treatments   Labs (all labs ordered are listed, but only abnormal results are displayed) Labs Reviewed  COMPREHENSIVE METABOLIC PANEL - Abnormal; Notable for the following components:      Result Value   Glucose, Bld 117 (*)    Creatinine, Ser 1.27 (*)    Total Protein 8.2 (*)    All other components within normal limits  CBC - Abnormal; Notable for the following components:   RBC 6.46 (*)    MCV 73.1 (*)    MCH 23.7 (*)    RDW 18.3 (*)    All other components within normal limits  LIPASE, BLOOD  URINALYSIS, ROUTINE W REFLEX MICROSCOPIC    EKG None  Radiology DG ABD ACUTE 2+V W 1V CHEST  Result Date: 03/25/2020 CLINICAL DATA:  Mid abdominal pain with nausea and vomiting for 3 days EXAM: DG ABDOMEN ACUTE W/ 1V CHEST COMPARISON:  09/06/2016 abdominal CT FINDINGS: Mild streaky density at the left base from atelectasis or scarring. There is no edema, consolidation, effusion, or pneumothorax. Normal heart size and mediastinal contours. No evidence of bowel obstruction or perforation. No abnormal stool retention. No concerning mass effect or stone. The  appendix is likely gas filled with patent lumen. IMPRESSION: 1. Negative abdominal radiographs. 2. Mild atelectasis or scarring at the left lower lobe. Electronically Signed   By: 09/08/2016 M.D.   On: 03/25/2020 10:17    Procedures Procedures (including critical care time)  Medications Ordered in ED Medications  sodium chloride flush (NS) 0.9 % injection 3 mL (3 mLs Intravenous Given 03/25/20 0701)  sodium chloride 0.9 % bolus 2,000 mL (2,000 mLs Intravenous New Bag/Given 03/25/20 0737)  pantoprazole (PROTONIX) injection 40 mg (40 mg Intravenous Given 03/25/20 0737)  HYDROmorphone (DILAUDID)  injection 0.5 mg (0.5 mg Intravenous Given 03/25/20 0737)  ondansetron (ZOFRAN) injection 4 mg (4 mg Intravenous Given 03/25/20 0736)  HYDROmorphone (DILAUDID) injection 1 mg (1 mg Intravenous Given 03/25/20 1001)    ED Course  I have reviewed the triage vital signs and the nursing notes.  Pertinent labs & imaging results that were available during my care of the patient were reviewed by me and considered in my medical decision making (see chart for details).    MDM Rules/Calculators/A&P                      Patient with abdominal pain and vomiting.  Patient improved with nausea medicine pain medicine protonic.  Labs and x-rays unremarkable.  Suspect gastritis.  Patient will follow up with GI     This patient presents to the ED for concern of abdominal pain, this involves an extensive number of treatment options, and is a complaint that carries with it a high risk of complications and morbidity.  The differential diagnosis includes gastritis appendicitis   Lab Tests:   I Ordered, reviewed, and interpreted labs, which included CBC chemistries which were unremarkable  Medicines ordered:   I ordered medication Zofran for nausea Protonix for gastritis  Imaging Studies ordered:   I ordered imaging studies which included acute abdominal series and  I independently visualized and interpreted  imaging which showed unremarkable  Additional history obtained:   Additional history obtained from records  Previous records obtained and reviewed  Consultations Obtained:  Reevaluation:  After the interventions stated above, I reevaluated the patient and found improved  Critical Interventions:  .   Final Clinical Impression(s) / ED Diagnoses Final diagnoses:  Pain of upper abdomen    Rx / DC Orders ED Discharge Orders         Ordered    pantoprazole (PROTONIX) 20 MG tablet  Daily     03/25/20 1048    ondansetron (ZOFRAN) 4 MG tablet  Every 6 hours     03/25/20 1048    HYDROcodone-acetaminophen (NORCO/VICODIN) 5-325 MG tablet  Every 6 hours PRN     03/25/20 1048           Bethann Berkshire, MD 03/25/20 1057

## 2020-03-25 NOTE — Discharge Instructions (Signed)
Follow-up with [facility] gastroenterologist.  You can see any of the providers there.  Dr. Matthias Hughs is one of the [facility] gastroenterologist.  Call make an appointment and tell them that you were in the emergency department return sooner if any problems

## 2020-03-25 NOTE — ED Notes (Signed)
Pt encouraged to provide urine sample. Sts he does not have to go at this time. Pt reminded he is receiving IVF and has a urinal at the bedside.

## 2020-07-25 ENCOUNTER — Encounter (HOSPITAL_COMMUNITY): Payer: Self-pay

## 2020-07-25 ENCOUNTER — Other Ambulatory Visit: Payer: Self-pay

## 2020-07-25 ENCOUNTER — Emergency Department (HOSPITAL_COMMUNITY)
Admission: EM | Admit: 2020-07-25 | Discharge: 2020-07-25 | Disposition: A | Discharge status: Home / Self Care | Payer: Self-pay | Attending: Emergency Medicine | Admitting: Emergency Medicine

## 2020-07-25 DIAGNOSIS — R112 Nausea with vomiting, unspecified: Secondary | ICD-10-CM | POA: Insufficient documentation

## 2020-07-25 DIAGNOSIS — J45909 Unspecified asthma, uncomplicated: Secondary | ICD-10-CM | POA: Insufficient documentation

## 2020-07-25 DIAGNOSIS — F17210 Nicotine dependence, cigarettes, uncomplicated: Secondary | ICD-10-CM | POA: Insufficient documentation

## 2020-07-25 DIAGNOSIS — Z79899 Other long term (current) drug therapy: Secondary | ICD-10-CM | POA: Insufficient documentation

## 2020-07-25 DIAGNOSIS — R1013 Epigastric pain: Secondary | ICD-10-CM | POA: Insufficient documentation

## 2020-07-25 LAB — COMPREHENSIVE METABOLIC PANEL WITH GFR
ALT: 21 U/L (ref 0–44)
AST: 29 U/L (ref 15–41)
Albumin: 5.1 g/dL — ABNORMAL HIGH (ref 3.5–5.0)
Alkaline Phosphatase: 43 U/L (ref 38–126)
Anion gap: 17 — ABNORMAL HIGH (ref 5–15)
BUN: 21 mg/dL — ABNORMAL HIGH (ref 6–20)
CO2: 19 mmol/L — ABNORMAL LOW (ref 22–32)
Calcium: 9.8 mg/dL (ref 8.9–10.3)
Chloride: 98 mmol/L (ref 98–111)
Creatinine, Ser: 1.38 mg/dL — ABNORMAL HIGH (ref 0.61–1.24)
GFR calc Af Amer: >60 mL/min
GFR calc non Af Amer: >60 mL/min
Glucose, Bld: 138 mg/dL — ABNORMAL HIGH (ref 70–99)
Potassium: 4 mmol/L (ref 3.5–5.1)
Sodium: 134 mmol/L — ABNORMAL LOW (ref 135–145)
Total Bilirubin: 1 mg/dL (ref 0.3–1.2)
Total Protein: 9 g/dL — ABNORMAL HIGH (ref 6.5–8.1)

## 2020-07-25 LAB — CBC
HCT: 50.4 % (ref 39.0–52.0)
Hemoglobin: 16.4 g/dL (ref 13.0–17.0)
MCH: 23.3 pg — ABNORMAL LOW (ref 26.0–34.0)
MCHC: 32.5 g/dL (ref 30.0–36.0)
MCV: 71.5 fL — ABNORMAL LOW (ref 80.0–100.0)
Platelets: 224 10*3/uL (ref 150–400)
RBC: 7.05 MIL/uL — ABNORMAL HIGH (ref 4.22–5.81)
RDW: 18 % — ABNORMAL HIGH (ref 11.5–15.5)
WBC: 6 10*3/uL (ref 4.0–10.5)
nRBC: 0 % (ref 0.0–0.2)

## 2020-07-25 LAB — LIPASE, BLOOD: Lipase: 33 U/L (ref 11–51)

## 2020-07-25 MED ORDER — SODIUM CHLORIDE 0.9 % IV BOLUS
1000.0000 mL | Freq: Once | INTRAVENOUS | Status: AC
  Administered 2020-07-25: 1000 mL via INTRAVENOUS

## 2020-07-25 MED ORDER — MORPHINE SULFATE (PF) 4 MG/ML IV SOLN
4.0000 mg | Freq: Once | INTRAVENOUS | Status: AC
  Administered 2020-07-25: 4 mg via INTRAVENOUS
  Filled 2020-07-25: qty 1

## 2020-07-25 MED ORDER — ALUM & MAG HYDROXIDE-SIMETH 200-200-20 MG/5ML PO SUSP
30.0000 mL | Freq: Once | ORAL | Status: AC
  Administered 2020-07-25: 30 mL via ORAL
  Filled 2020-07-25: qty 30

## 2020-07-25 MED ORDER — FAMOTIDINE IN NACL 20-0.9 MG/50ML-% IV SOLN
20.0000 mg | Freq: Once | INTRAVENOUS | Status: AC
  Administered 2020-07-25: 20 mg via INTRAVENOUS
  Filled 2020-07-25: qty 50

## 2020-07-25 MED ORDER — PROMETHAZINE HCL 25 MG PO TABS: 25.0000 mg | ORAL_TABLET | Freq: Four times a day (QID) | ORAL | 0 refills | Status: AC | PRN

## 2020-07-25 MED ORDER — ONDANSETRON 4 MG PO TBDP
4.0000 mg | ORAL_TABLET | Freq: Once | ORAL | Status: AC | PRN
  Administered 2020-07-25: 4 mg via ORAL
  Filled 2020-07-25: qty 1

## 2020-07-25 MED ORDER — ONDANSETRON HCL 4 MG/2ML IJ SOLN
4.0000 mg | Freq: Once | INTRAMUSCULAR | Status: AC
  Administered 2020-07-25: 4 mg via INTRAVENOUS
  Filled 2020-07-25: qty 2

## 2020-07-25 MED ORDER — OMEPRAZOLE 40 MG PO CPDR: 40.0000 mg | DELAYED_RELEASE_CAPSULE | Freq: Every day | ORAL | 0 refills | Status: AC

## 2020-07-25 MED ORDER — HALOPERIDOL LACTATE 5 MG/ML IJ SOLN
5.0000 mg | Freq: Once | INTRAMUSCULAR | Status: AC
  Administered 2020-07-25: 5 mg via INTRAVENOUS
  Filled 2020-07-25: qty 1

## 2020-07-25 MED ORDER — FAMOTIDINE 20 MG PO TABS: 20.0000 mg | ORAL_TABLET | Freq: Two times a day (BID) | ORAL | 0 refills | Status: AC

## 2020-07-25 MED ORDER — LIDOCAINE VISCOUS HCL 2 % MT SOLN
15.0000 mL | Freq: Once | OROMUCOSAL | Status: AC
  Administered 2020-07-25: 15 mL via ORAL
  Filled 2020-07-25: qty 15

## 2020-07-25 NOTE — ED Provider Notes (Signed)
[facility]-EMERGENCY DEPT Provider Note   CSN: [id number] Arrival date & time: 07/25/20  1400     History Chief Complaint  Patient presents with  . Abdominal Pain  . Emesis    Edward Barrera is a 39 y.o. male presents to the ED for evaluation of abdominal pain.  This began on Tuesday.  Described as constant, 10 out of 10.  Patient points to his entire abdomen specifically in the epigastrium, left and right upper quadrant.  States the pain radiates to his back along his flanks bilaterally.  States occasionally he will have no pain but then all of a sudden it comes back and it is severe.  Has associated nausea and vomiting.  He is not sure if there is blood in his vomit.  Denies any radiation of pain into the chest, fever, changes in bowel movements, issues with urination.  No cough or shortness of breath. He drinks "a few" shots every day but states has not been able to do this since Wednesday.  Feels like the pain significantly worsened on Wednesday when he tried to take a few sips of alcohol.  Smokes marijuana and smokes cigarettes. Denies significant or frequent use of NSAIDs. He has not attempted any over-the-counter medicines for this.  Denies any recent exposure to suspicious foods, sick contacts.  Chart shows that patient has been seen in the ED for abdominal pain in the past.  States that he has never been told what is going on and is given pain medicine.  Has not followed up with GI.  He does not have a primary care doctor.   HPI     Past Medical History:  Diagnosis Date  . Asthma     There are no problems to display for this patient.   Past Surgical History:  Procedure Laterality Date  . I & D EXTREMITY  04/29/2012   Procedure: IRRIGATION AND DEBRIDEMENT EXTREMITY;  Surgeon: Dominica Severin, MD;  Location: [facility];  Service: Orthopedics;  Laterality: Right;       History reviewed. No pertinent family history.  Social History   Tobacco Use  .  Smoking status: Current Every Day Smoker    Packs/day: 0.50    Types: Cigarettes  . Smokeless tobacco: Never Used  Vaping Use  . Vaping Use: Never used  Substance Use Topics  . Alcohol use: Yes    Comment: on weekends  . Drug use: No    Home Medications Prior to Admission medications   Medication Sig Start Date End Date Taking? Authorizing Provider  dicyclomine (BENTYL) 20 MG tablet Take 1 tablet (20 mg total) by mouth 2 (two) times daily. Patient not taking: Reported on 02/16/2017 09/05/16   de Villier, Daryl F II, PA  famotidine (PEPCID) 20 MG tablet Take 1 tablet (20 mg total) by mouth 2 (two) times daily. 07/25/20   Liberty Handy, PA-C  HYDROcodone-acetaminophen (NORCO/VICODIN) 5-325 MG tablet Take 1 tablet by mouth every 6 (six) hours as needed for moderate pain. Patient not taking: Reported on 07/25/2020 03/25/20   Bethann Berkshire, MD  omeprazole (PRILOSEC) 40 MG capsule Take 1 capsule (40 mg total) by mouth daily. 07/25/20 08/24/20  Liberty Handy, PA-C  ondansetron (ZOFRAN ODT) 4 MG disintegrating tablet Take 1 tablet (4 mg total) by mouth every 8 (eight) hours as needed for nausea or vomiting. Patient not taking: Reported on 02/16/2017 09/05/16   de Villier, Daryl F II, PA  ondansetron (ZOFRAN ODT) 4 MG disintegrating  tablet Take 1 tablet (4 mg total) by mouth every 8 (eight) hours as needed for nausea or vomiting. Patient not taking: Reported on 03/25/2020 05/01/18   Horton, Mayer Maskerourtney F, MD  ondansetron (ZOFRAN) 4 MG tablet Take 1 tablet (4 mg total) by mouth every 6 (six) hours. Patient not taking: Reported on 07/25/2020 03/25/20   Bethann BerkshireZammit, Joseph, MD  pantoprazole (PROTONIX) 20 MG tablet Take 1 tablet (20 mg total) by mouth daily. 03/25/20   Bethann BerkshireZammit, Joseph, MD  promethazine (PHENERGAN) 25 MG tablet Take 1 tablet (25 mg total) by mouth every 6 (six) hours as needed for nausea or vomiting. 07/25/20   Liberty HandyGibbons, Mayjor Ager J, PA-C    Allergies    Patient has no known allergies.  Review of  Systems   Review of Systems  Gastrointestinal: Positive for abdominal pain, nausea and vomiting.  Genitourinary: Positive for flank pain (bilateral).  All other systems reviewed and are negative.   Physical Exam Updated Vital Signs BP (!) 147/109 (BP Location: Left Arm)   Pulse (!) 101   Temp 98.2 F (36.8 C) (Oral)   Resp 18   Ht 6\' 2"  (1.88 m)   Wt 117.9 kg   SpO2 100%   BMI 33.38 kg/m   Physical Exam Vitals and nursing note reviewed.  Constitutional:      Appearance: He is well-developed.     Comments: NAD.  HENT:     Head: Normocephalic and atraumatic.     Right Ear: External ear normal.     Left Ear: External ear normal.     Nose: Nose normal.  Eyes:     Conjunctiva/sclera: Conjunctivae normal.  Cardiovascular:     Rate and Rhythm: Normal rate and regular rhythm.     Heart sounds: Normal heart sounds.     Comments: 1+ radial and DP pulses bilaterally.   Pulmonary:     Effort: Pulmonary effort is normal.     Breath sounds: Normal breath sounds.     Comments: Normal work of breathing, clear sounds bilaterally.  Abdominal:     Tenderness: There is abdominal tenderness in the right upper quadrant, epigastric area and left upper quadrant. There is right CVA tenderness and left CVA tenderness.     Comments: Diffuse abdominal tenderness, patient pushes my hand away.  States worse in epigastrium, LUQ, RUQ, periumbilical areas.  +bilateral CVAT.  No guarding, rigidity. Active BS to lower quadrants. Negative Murphy's and McBurney's. No pulsatility   Musculoskeletal:        General: No deformity. Normal range of motion.     Cervical back: Normal range of motion and neck supple.  Skin:    General: Skin is warm and dry.     Capillary Refill: Capillary refill takes less than 2 seconds.  Neurological:     Mental Status: He is alert and oriented to person, place, and time.     Comments: Sensation and strength intact upper/lower extremities   Psychiatric:        Behavior:  Behavior normal.        Thought Content: Thought content normal.        Judgment: Judgment normal.     ED Results / Procedures / Treatments   Labs (all labs ordered are listed, but only abnormal results are displayed) Labs Reviewed  COMPREHENSIVE METABOLIC PANEL - Abnormal; Notable for the following components:      Result Value   Sodium 134 (*)    CO2 19 (*)    Glucose, Bld 138 (*)  BUN 21 (*)    Creatinine, Ser 1.38 (*)    Total Protein 9.0 (*)    Albumin 5.1 (*)    Anion gap 17 (*)    All other components within normal limits  CBC - Abnormal; Notable for the following components:   RBC 7.05 (*)    MCV 71.5 (*)    MCH 23.3 (*)    RDW 18.0 (*)    All other components within normal limits  LIPASE, BLOOD  URINALYSIS, ROUTINE W REFLEX MICROSCOPIC    EKG None  Radiology No results found.  Procedures Procedures (including critical care time)  Medications Ordered in ED Medications  ondansetron (ZOFRAN-ODT) disintegrating tablet 4 mg (4 mg Oral Given 07/25/20 1449)  sodium chloride 0.9 % bolus 1,000 mL (1,000 mLs Intravenous New Bag/Given 07/25/20 1626)  ondansetron (ZOFRAN) injection 4 mg (4 mg Intravenous Given 07/25/20 1616)  famotidine (PEPCID) IVPB 20 mg premix (0 mg Intravenous Stopped 07/25/20 1711)  alum & mag hydroxide-simeth (MAALOX/MYLANTA) 200-200-20 MG/5ML suspension 30 mL (30 mLs Oral Given 07/25/20 1620)    And  lidocaine (XYLOCAINE) 2 % viscous mouth solution 15 mL (15 mLs Oral Given 07/25/20 1619)  morphine 4 MG/ML injection 4 mg (4 mg Intravenous Given 07/25/20 1615)  haloperidol lactate (HALDOL) injection 5 mg (5 mg Intravenous Given 07/25/20 1720)    ED Course  I have reviewed the triage vital signs and the nursing notes.  Pertinent labs & imaging results that were available during my care of the patient were reviewed by me and considered in my medical decision making (see chart for details).  Clinical Course as of Jul 26 1847  Sat Jul 25, 2020  1602  Creatinine(!): 1.38 [CG]  1602 AST: 29 [CG]  1602 ALT: 21 [CG]  1602 Alkaline Phosphatase: 43 [CG]  1602 Glucose(!): 138 [CG]  1602 Anion gap(!): 17 [CG]  1602 Lipase: 33 [CG]  1602 WBC: 6.0 [CG]  1653 Reevaluated patient.  Asleep.  Easily arousable.  Reports he vomited the GI cocktail but overall nausea is better.  Points to epigastrium and continues to say he has got pain there.  Will give Haldol, reassess.   [CG]    Clinical Course User Index [CG] Jerrell Mylar   MDM Rules/Calculators/A&P                          39 year old male presents for acute on chronic abdominal pain.  History of same and seen in the ED several years ago.  Admits to daily alcohol use, marijuana use, tobacco use.  Pain worsened on Wednesday after trying to drink alcohol.  I obtained additional history from triage, nursing notes and review of medical chart.  Previous medical records available, nursing notes reviewed to obtain more history and assist with MDM  Seen in the ER several times over the last few years for similar abdominal pain.  Has had abdominal x-ray, RUQ ultrasounds that have been normal.  Chief complain involves an extensive number of treatment options and is a complaint that carries with it a high risk of complications and morbidity and mortality.    The differential diagnosis includes gastritis, PUD, GERD, viral gastroenteritis, pancreatitis.  He is overall well-appearing afebrile without any peritonitis, negative Murphy's and McBurney's.  Appendicitis, cholecystitis, perforated viscus, diverticulitis considered less likely.  No history of kidney stones, urinary symptoms and renal process less likely as well.  No neuro or pulse deficits distally and doubt vascular process.  Laboratory studies and imaging ordered in triage RN and I ordered additional laboratory and imaging studies including CBC, CMP, lipase, urinalysis.  I have ordered IV fluids, morphine, GI cocktail, Pepcid,  Zofran.  I have personally visualized and interpreted labs and imaging.    ER work up reveals hyperglycemia, mild elevation in creatinine 1.38, anion gap 17. Suspect all of this is secondary to dehydration.  NO leukocytosis. Normal LFT and lipase.    I ordered medications including IVF, morphine, zofran, GI cocktail.  Will reassess, repeat abd exam.   I have re-evaluated the patient.   Reports continued epigastric tenderness but milder.  Haldol ordered.   1845: re-evaluated patient, reports significant improvement in abdominal pain after haldol.  Tolerating PO. Improved epigastric tenderness.  Suspect GI process PUD, gastritis, cannabinoid hyperemesis syndrome, virus.  Recommended alcohol and marijuana cessation ,diet changes, GI follow up. Return precautions given. Pt in agreement.  Final Clinical Impression(s) / ED Diagnoses Final diagnoses:  Epigastric abdominal pain    Rx / DC Orders ED Discharge Orders         Ordered    omeprazole (PRILOSEC) 40 MG capsule  Daily        07/25/20 1841    promethazine (PHENERGAN) 25 MG tablet  Every 6 hours PRN        07/25/20 1841    famotidine (PEPCID) 20 MG tablet  2 times daily        07/25/20 1843           Jerrell Mylar 07/25/20 Norberto Sorenson, MD 07/26/20 1649

## 2020-07-25 NOTE — Discharge Instructions (Addendum)
You were seen in the ER for abdominal pain. Labs were normal.   I suspect your symptoms may be from gastritis, acid reflux or possibly ulcer.  Could have also been a virus. All of these are managed similarly.   We will treat this with anti-acid medicines.  Start taking omeprazole to 40 mg daily, take on an empty stomach first thing in the morning and wait 20-30 min before eating.  Add famotidine 20 mg in the AM and PM.    Avoid irritating foods and liquids such as alcohol, greasy/fatty or acidic foods. Avoid ibuprofen containing products.   Follow up with gastroenterology doctor for further management of this if it persits. You may need an endoscopy  Return to the ER for fever, chills, blood in vomit or stool, worsening localized abdominal pain to right upper or right lower abdomen, inability to tolerate fluids.

## 2020-07-25 NOTE — ED Triage Notes (Signed)
Patient c/o mid abdomenal pain x 4 days and emesis since yesterday. Patient denies any diarrhea.
